# Patient Record
Sex: Male | Born: 1970 | Race: White | Hispanic: No | Marital: Married | Smoking: Never smoker
Health system: Southern US, Community
[De-identification: ages and names within clinical notes are randomized; demographics above are authoritative.]

## PROBLEM LIST (undated history)

## (undated) DIAGNOSIS — K635 Polyp of colon: Secondary | ICD-10-CM

## (undated) DIAGNOSIS — S2239XA Fracture of one rib, unspecified side, initial encounter for closed fracture: Secondary | ICD-10-CM

## (undated) DIAGNOSIS — K859 Acute pancreatitis without necrosis or infection, unspecified: Secondary | ICD-10-CM

## (undated) DIAGNOSIS — S4291XA Fracture of right shoulder girdle, part unspecified, initial encounter for closed fracture: Secondary | ICD-10-CM

## (undated) DIAGNOSIS — K219 Gastro-esophageal reflux disease without esophagitis: Secondary | ICD-10-CM

## (undated) DIAGNOSIS — G4730 Sleep apnea, unspecified: Secondary | ICD-10-CM

## (undated) HISTORY — DX: Polyp of colon: K63.5

## (undated) HISTORY — DX: Fracture of right shoulder girdle, part unspecified, initial encounter for closed fracture: S42.91XA

## (undated) HISTORY — DX: Fracture of one rib, unspecified side, initial encounter for closed fracture: S22.39XA

## (undated) HISTORY — DX: Gastro-esophageal reflux disease without esophagitis: K21.9

---

## 2004-07-12 ENCOUNTER — Encounter: Payer: Self-pay | Admitting: Nurse Practitioner

## 2005-04-13 HISTORY — PX: APPENDECTOMY: SHX54

## 2007-06-30 ENCOUNTER — Other Ambulatory Visit: Payer: Self-pay

## 2007-06-30 ENCOUNTER — Emergency Department: Payer: Self-pay | Admitting: Emergency Medicine

## 2008-04-26 ENCOUNTER — Emergency Department (HOSPITAL_COMMUNITY): Admission: EM | Admit: 2008-04-26 | Discharge: 2008-04-27 | Payer: Self-pay | Admitting: Emergency Medicine

## 2008-04-28 ENCOUNTER — Emergency Department (HOSPITAL_COMMUNITY): Admission: EM | Admit: 2008-04-28 | Discharge: 2008-04-29 | Payer: Self-pay | Admitting: Emergency Medicine

## 2008-04-30 ENCOUNTER — Ambulatory Visit: Payer: Self-pay | Admitting: Gastroenterology

## 2008-04-30 ENCOUNTER — Encounter: Payer: Self-pay | Admitting: Nurse Practitioner

## 2008-04-30 DIAGNOSIS — K859 Acute pancreatitis without necrosis or infection, unspecified: Secondary | ICD-10-CM | POA: Insufficient documentation

## 2008-04-30 DIAGNOSIS — G4733 Obstructive sleep apnea (adult) (pediatric): Secondary | ICD-10-CM | POA: Insufficient documentation

## 2008-04-30 DIAGNOSIS — G4730 Sleep apnea, unspecified: Secondary | ICD-10-CM

## 2008-05-01 LAB — CONVERTED CEMR LAB
Cholesterol: 185 mg/dL (ref 0–200)
HDL: 28.8 mg/dL — ABNORMAL LOW (ref >39.0)
Triglycerides: 88 mg/dL (ref 0–149)
VLDL: 18 mg/dL (ref 0–40)

## 2008-05-02 ENCOUNTER — Telehealth: Payer: Self-pay | Admitting: Nurse Practitioner

## 2008-05-07 ENCOUNTER — Telehealth: Payer: Self-pay | Admitting: Nurse Practitioner

## 2008-05-14 ENCOUNTER — Telehealth: Payer: Self-pay | Admitting: Gastroenterology

## 2008-05-18 ENCOUNTER — Telehealth: Payer: Self-pay | Admitting: Gastroenterology

## 2008-05-28 ENCOUNTER — Ambulatory Visit: Payer: Self-pay | Admitting: Gastroenterology

## 2008-05-30 ENCOUNTER — Telehealth: Payer: Self-pay | Admitting: Gastroenterology

## 2008-05-30 ENCOUNTER — Ambulatory Visit (HOSPITAL_COMMUNITY): Admission: RE | Admit: 2008-05-30 | Discharge: 2008-05-30 | Payer: Self-pay | Admitting: Gastroenterology

## 2008-05-30 ENCOUNTER — Ambulatory Visit: Payer: Self-pay | Admitting: Gastroenterology

## 2008-06-08 ENCOUNTER — Telehealth: Payer: Self-pay | Admitting: Gastroenterology

## 2008-06-25 ENCOUNTER — Ambulatory Visit: Payer: Self-pay | Admitting: Gastroenterology

## 2008-06-26 ENCOUNTER — Encounter: Payer: Self-pay | Admitting: Gastroenterology

## 2008-07-18 ENCOUNTER — Encounter: Payer: Self-pay | Admitting: Gastroenterology

## 2008-07-19 ENCOUNTER — Encounter: Admission: RE | Admit: 2008-07-19 | Discharge: 2008-07-19 | Payer: Self-pay | Admitting: General Surgery

## 2008-08-07 ENCOUNTER — Encounter: Payer: Self-pay | Admitting: Gastroenterology

## 2009-01-31 ENCOUNTER — Inpatient Hospital Stay: Payer: Self-pay | Admitting: Internal Medicine

## 2009-09-10 ENCOUNTER — Ambulatory Visit: Payer: Self-pay | Admitting: Family Medicine

## 2010-04-30 ENCOUNTER — Ambulatory Visit: Payer: Self-pay | Admitting: Family Medicine

## 2010-05-04 ENCOUNTER — Encounter: Payer: Self-pay | Admitting: Gastroenterology

## 2010-05-22 ENCOUNTER — Other Ambulatory Visit: Payer: Self-pay | Admitting: Podiatry

## 2010-05-22 DIAGNOSIS — T148XXA Other injury of unspecified body region, initial encounter: Secondary | ICD-10-CM

## 2010-05-22 DIAGNOSIS — R52 Pain, unspecified: Secondary | ICD-10-CM

## 2010-05-27 ENCOUNTER — Other Ambulatory Visit: Payer: Self-pay

## 2010-07-28 LAB — DIFFERENTIAL
Basophils Absolute: 0 10*3/uL (ref 0.0–0.1)
Basophils Relative: 0 % (ref 0–1)
Eosinophils Absolute: 0.2 10*3/uL (ref 0.0–0.7)
Eosinophils Relative: 2 % (ref 0–5)
Lymphocytes Relative: 25 % (ref 12–46)
Lymphocytes Relative: 12 % (ref 12–46)
Lymphs Abs: 2.8 10*3/uL (ref 0.7–4.0)
Lymphs Abs: 1.4 10*3/uL (ref 0.7–4.0)
Monocytes Absolute: 0.8 10*3/uL (ref 0.1–1.0)
Monocytes Relative: 7 % (ref 3–12)
Neutro Abs: 9.2 10*3/uL — ABNORMAL HIGH (ref 1.7–7.7)
Neutrophils Relative %: 66 % (ref 43–77)
Neutrophils Relative %: 79 % — ABNORMAL HIGH (ref 43–77)

## 2010-07-28 LAB — COMPREHENSIVE METABOLIC PANEL
ALT: 22 U/L (ref 0–53)
ALT: 19 U/L (ref 0–53)
AST: 22 U/L (ref 0–37)
AST: 20 U/L (ref 0–37)
Albumin: 4.1 g/dL (ref 3.5–5.2)
Albumin: 3.8 g/dL (ref 3.5–5.2)
Alkaline Phosphatase: 55 U/L (ref 39–117)
Alkaline Phosphatase: 61 U/L (ref 39–117)
BUN: 19 mg/dL (ref 6–23)
BUN: 8 mg/dL (ref 6–23)
CO2: 24 mEq/L (ref 19–32)
CO2: 27 mEq/L (ref 19–32)
Calcium: 9 mg/dL (ref 8.4–10.5)
Calcium: 8.8 mg/dL (ref 8.4–10.5)
Chloride: 105 mEq/L (ref 96–112)
Chloride: 100 mEq/L (ref 96–112)
Creatinine, Ser: 0.86 mg/dL (ref 0.4–1.5)
Creatinine, Ser: 1.03 mg/dL (ref 0.4–1.5)
GFR calc Af Amer: >60 mL/min (ref >60)
GFR calc Af Amer: >60 mL/min (ref >60)
GFR calc non Af Amer: >60 mL/min (ref >60)
GFR calc non Af Amer: >60 mL/min (ref >60)
Glucose, Bld: 81 mg/dL (ref 70–99)
Glucose, Bld: 102 mg/dL — ABNORMAL HIGH (ref 70–99)
Potassium: 3.7 mEq/L (ref 3.5–5.1)
Potassium: 4.3 mEq/L (ref 3.5–5.1)
Sodium: 139 mEq/L (ref 135–145)
Sodium: 133 mEq/L — ABNORMAL LOW (ref 135–145)
Total Bilirubin: 1.5 mg/dL — ABNORMAL HIGH (ref 0.3–1.2)
Total Bilirubin: 1.8 mg/dL — ABNORMAL HIGH (ref 0.3–1.2)
Total Protein: 7.1 g/dL (ref 6.0–8.3)
Total Protein: 7.2 g/dL (ref 6.0–8.3)

## 2010-07-28 LAB — URINALYSIS, ROUTINE W REFLEX MICROSCOPIC
Bilirubin Urine: NEGATIVE
Glucose, UA: NEGATIVE mg/dL
Hgb urine dipstick: NEGATIVE
Ketones, ur: NEGATIVE mg/dL
Nitrite: NEGATIVE
Protein, ur: NEGATIVE mg/dL
Specific Gravity, Urine: 1.028 (ref 1.005–1.030)
Urobilinogen, UA: 0.2 mg/dL (ref 0.0–1.0)
pH: 6 (ref 5.0–8.0)

## 2010-07-28 LAB — CBC
HCT: 45.4 % (ref 39.0–52.0)
Hemoglobin: 15.3 g/dL (ref 13.0–17.0)
Hemoglobin: 15.3 g/dL (ref 13.0–17.0)
MCHC: 33.8 g/dL (ref 30.0–36.0)
MCHC: 33.8 g/dL (ref 30.0–36.0)
MCV: 85.9 fL (ref 78.0–100.0)
MCV: 85.5 fL (ref 78.0–100.0)
Platelets: 212 10*3/uL (ref 150–400)
RBC: 5.25 MIL/uL (ref 4.22–5.81)
RBC: 5.31 MIL/uL (ref 4.22–5.81)
RDW: 13.6 % (ref 11.5–15.5)
WBC: 11.6 10*3/uL — ABNORMAL HIGH (ref 4.0–10.5)

## 2010-07-28 LAB — POCT I-STAT, CHEM 8
BUN: 23 mg/dL (ref 6–23)
Calcium, Ion: 1.18 mmol/L (ref 1.12–1.32)
Chloride: 106 meq/L (ref 96–112)
Creatinine, Ser: 1 mg/dL (ref 0.4–1.5)
Glucose, Bld: 85 mg/dL (ref 70–99)
HCT: 46 % (ref 39.0–52.0)
Hemoglobin: 15.6 g/dL (ref 13.0–17.0)
Potassium: 4 mEq/L (ref 3.5–5.1)
Sodium: 142 meq/L (ref 135–145)
TCO2: 26 mmol/L (ref 0–100)

## 2010-07-28 LAB — LIPASE, BLOOD
Lipase: 1410 U/L — ABNORMAL HIGH (ref 11–59)
Lipase: 293 U/L — ABNORMAL HIGH (ref 11–59)

## 2010-07-28 LAB — AMYLASE: Amylase: 221 U/L — ABNORMAL HIGH (ref 27–131)

## 2010-07-29 LAB — MISCELLANEOUS TEST

## 2010-08-26 NOTE — Consult Note (Signed)
NAME:  Nicholas Hobbs, Nicholas Hobbs NO.:  [id number]   MEDICAL RECORD NO.:  [id number]          PATIENT TYPE:  EMS   LOCATION:  MAJO                         FACILITY:  [facility]   PHYSICIAN:  Vania Rea, M.D. DATE OF BIRTH:  09-Feb-1971   DATE OF CONSULTATION:  04/29/2008  DATE OF DISCHARGE:  04/29/2008                                 CONSULTATION   PRIMARY CARE PHYSICIAN:  Dr. Julieanne Manson in [place].   REQUESTING PHYSICIAN:  Dr. Radford Pax, emergency room physician.   REASON FOR CONSULTATION:  Acute pancreatitis.   IMPRESSION:  1. Acute pancreatitis, resolving.  2. Abdominal pain inadequately controlled.  3. Obesity.   RECOMMENDATIONS:  1. Discharge the patient home to follow up with [facility] on Monday,      January 18, as discussed with Dr. Leone Payor of [facility].  2. Change pain medications to oxycodone every 3 hours p.r.n.  3. Continue clear liquid diet.  4. Return to ED if getting worse before Monday.   HISTORY OF PRESENT ILLNESS:  This is the third episode of acute  pancreatitis for this 40 year old, obese Caucasian gentleman.  The first  episode of pancreatitis was around 2003 to 2004.  He was treated at  [facility].  It was presumed to be alcohol related.  He  was hospitalized for about a week and discharged home in satisfactory  condition.  He had a second episode of pancreatitis in 2005.  Did not  resolve after extended hospital stay, and he was transferred to [facility] where he had ERCP and had a pancreatic stent  placed, per the patient, because of a stone. The patient did not have  cholecystectomy, was discharged home in satisfactory condition, and he  has had no problems until 2 days ago when he developed sharp left  anterior abdominal pain which he recognized as being similar to his  previous episodes of pancreatitis, associated with nausea.  There was no  vomiting.  It was aggravated by eating.  The patient  presented to the  emergency room at [facility].  He was evaluated and found to have a  serum lipase of over 1400.  A CT scan was done which showed inflammation  of the body and tail of the pancreas.  The gallbladder was contracted.  The patient was treated with Percocet 5/325 two tablets every 6 hours  p.r.n.  Advised to have clear liquid diet and discharged home.  The  patient returns this evening complaining of complete cessation of all  nausea.  He is tolerating a clear liquid diet very well.  However, the  left flank pain persists and is only temporarily relieved with Percocet.  He states that he is afraid of taking too much Percocet because of the  Tylenol content. He denies any fever or diarrhea.  In fact he has had no  bowel movement since Thursday.  He denies any abdominal swelling.  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  He says more in summer  and  more on weekends.  He last drank 2 beers about 2 weeks prior to the  start of this episode.  Also the patient has recently been on a diet 3  weeks ago.  He started a purification regimen, using [employer] herbal  cleansing preparations, and completed that about 5 days before the onset  of his symptoms.  He has lost about 15 pounds over the past 2 weeks,  through his deliberate actions.   PAST MEDICAL HISTORY:  1. Recurrent pancreatitis status post pancreatic stent in 2005 at      [facility].  2. History of GERD.  3. History of testicular torsion as a teenager.  4. Appendectomy in 2006.   MEDICATIONS:  1. Prilosec 20 mg daily.  2. Fish oil 1 tablet twice daily.  3. Percocet as noted above.   ALLERGIES:  BIAXIN causes itching and rash.   SOCIAL HISTORY:  He denies tobacco use.  Alcohol use as noted above.  Denies illicit drug use.  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   FAMILY HISTORY:  Significant for a sister status  post cholecystectomy  for acute cholecystitis.  Otherwise family history is unremarkable.   REVIEW OF SYSTEMS:  Other than noted above, a 10-point review of systems  is unremarkable.   PHYSICAL EXAMINATION:  GENERAL APPEARANCE: A pleasant, somewhat obese  young Caucasian gentleman lying in the stretcher, comfortable, in no  distress.  VITAL SIGNS: Temperature 98.9, pulse 71, respirations 18, blood pressure  134/77.  He is saturating at 96% on room air.  HEENT: His pupils are round and equal.  Mucous membranes are pink and  anicteric.  NECK: He has no cervical lymphadenopathy or thyromegaly.  No jugular  venous distention.  CHEST:  Clear to auscultation bilaterally.  CARDIOVASCULAR: Regular rhythm without murmur.  ABDOMEN:  Obese but soft.  He does have some left flank tenderness.  He  has normal abdominal bowel sounds.  EXTREMITIES:  Without edema. He has 2+ pulses bilaterally.  CENTRAL NERVOUS SYSTEM:  Cranial nerves II through XII are grossly  intact, and he has no focal neurological deficits.   LABS:  His white count is 11.6, hemoglobin 5.3, platelets 212.  He has  79% neutrophils.  His absolute neutrophil count is 9.2.  His sodium is  133, potassium 4.3, chloride 100, CO2 of 27, BUN 8, creatinine 1.03,  glucose 102.  His total bilirubin is 1.8.  His liver functions are  otherwise normal.  Total protein 7.2, albumin 3.8.  His calcium is 8.8.  His lipase today has decreased to 193.   A CT scan of the abdomen done yesterday showed stranding/inflammation  around the pancreatic body and tail, compatible with acute pancreatitis.  The underlying gland appears to enhance.  There is a small amount of  inflammation of the left anterior pararenal space.  Liver, spleen,  adrenals, kidneys unremarkable.  Gallbladder is  contracted.  The bowels are grossly unremarkable.  No free fluid, free  air, or adenopathy.  Accessory spleen noted in the splenic hilum.  Minimal dependent atelectasis in  the lung bases. No effusions.  A CT  scan of the pelvis was unremarkable.      Vania Rea, M.D.  Electronically Signed     LC/MEDQ  D:  04/29/2008  T:  04/29/2008  Job:  [id number]   cc:   Keturah Barre, MD,FACG  Nelva Nay, MD

## 2010-09-18 IMAGING — CT CT ABDOMEN W/ CM
2 of 5 series · 17 of 46 positions shown, 19 images · IV contrast (agent unspecified)
Comparison: None

CT ABDOMEN

CLINICAL DATA: Left upper quadrant pain

CT ABDOMEN AND PELVIS WITH CONTRAST
TECHNIQUE: Multidetector CT imaging of the abdomen and pelvis was
performed using the standard protocol following bolus
administration of intravenous contrast.
Contrast: 100 ml 3mnipaque-1WW

[Series 2: abd/pelv with 5.0 b31f st · axial · 0.82mm/px · z∈[+662,+1086]mm · 14 of 97 slices shown, 16 images]
[im 6/97  soft-tissue]
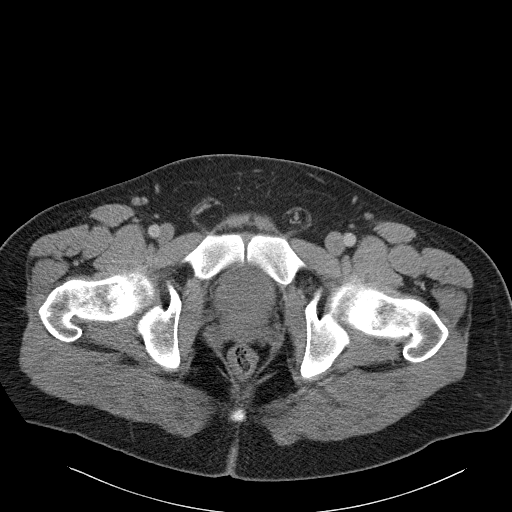
[im 6/97  bone]
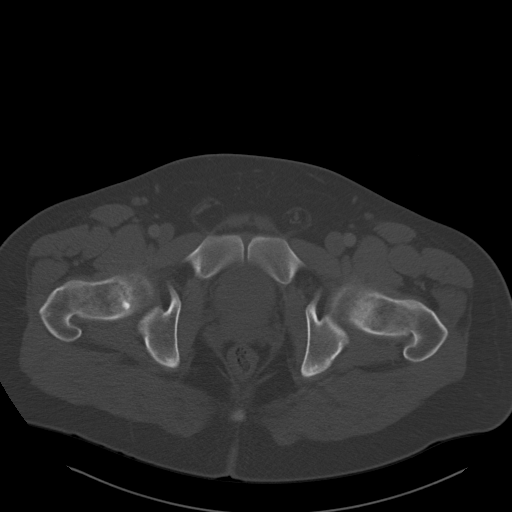
[im 11/97  soft-tissue]
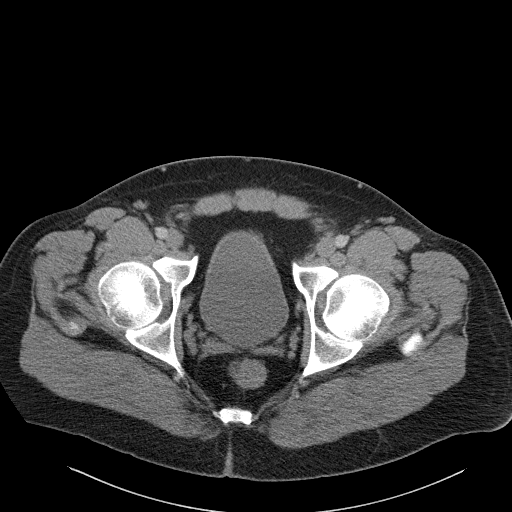
[im 22/97  soft-tissue]
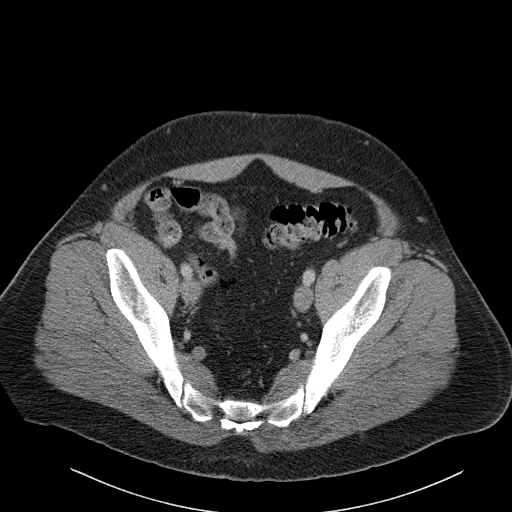
[im 27/97  soft-tissue]
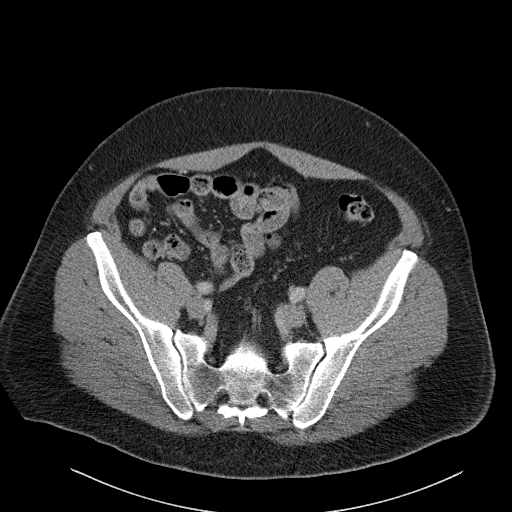
[im 33/97  soft-tissue]
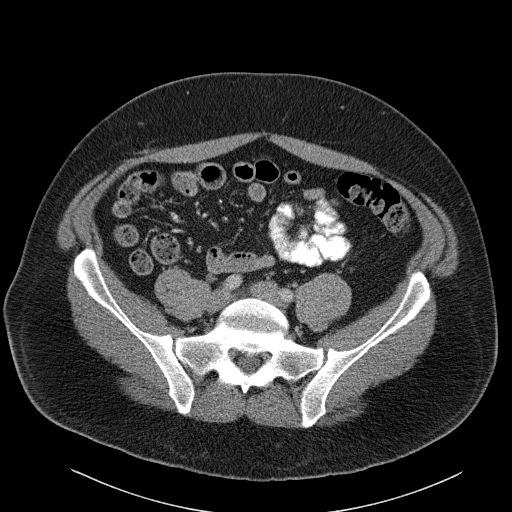
[im 38/97  soft-tissue]
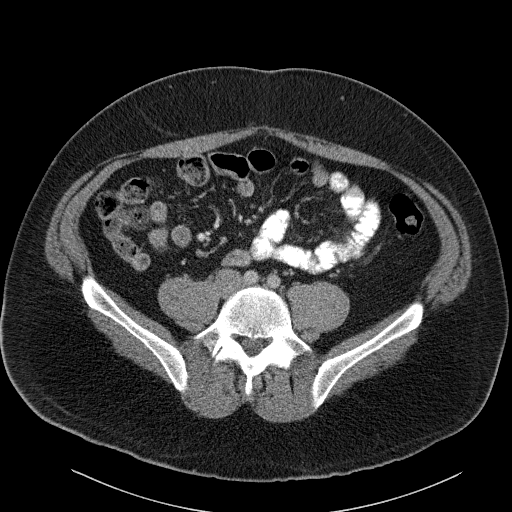
[im 43/97  soft-tissue]
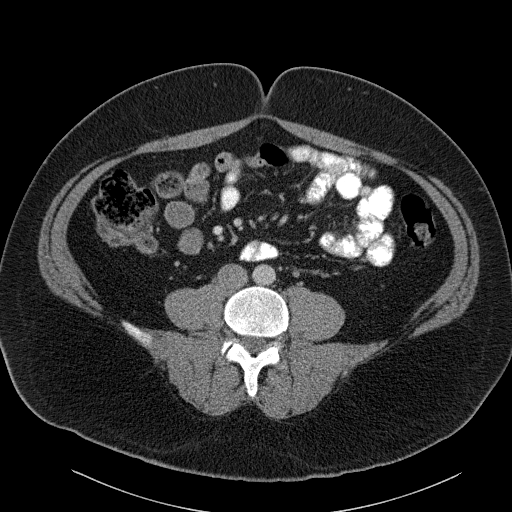
[im 54/97  soft-tissue]
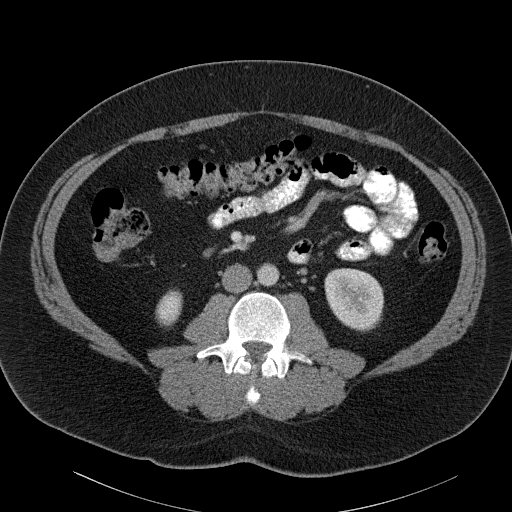
[im 59/97  soft-tissue]
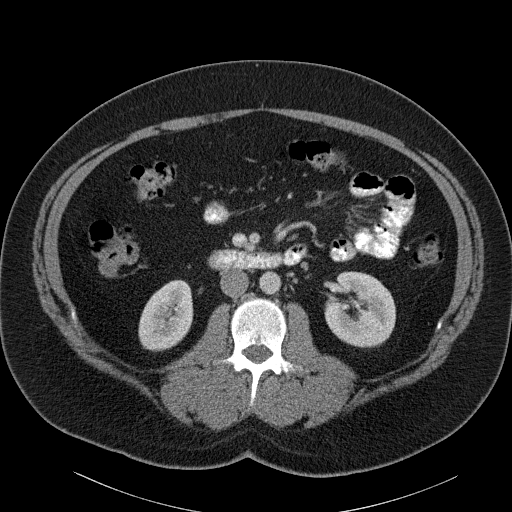
[im 59/97  bone]
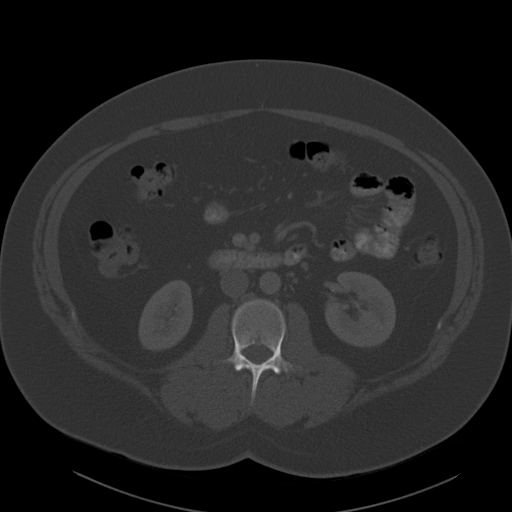
[im 65/97  soft-tissue]
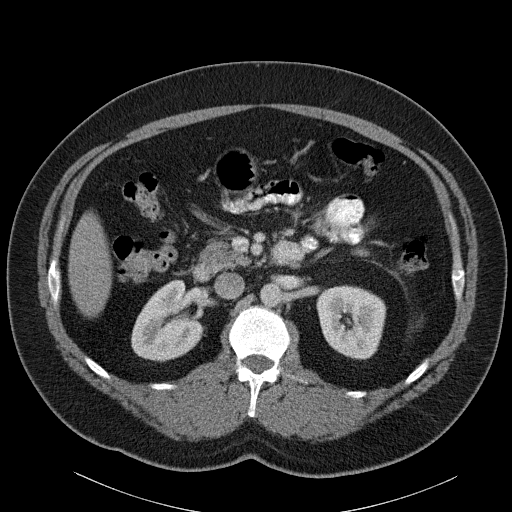
[im 70/97  soft-tissue]
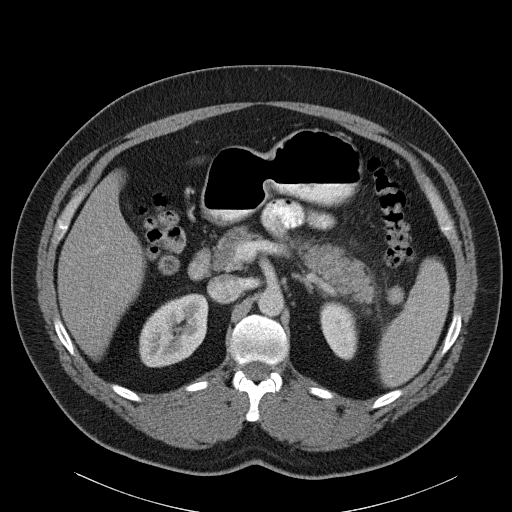
[im 75/97  soft-tissue]
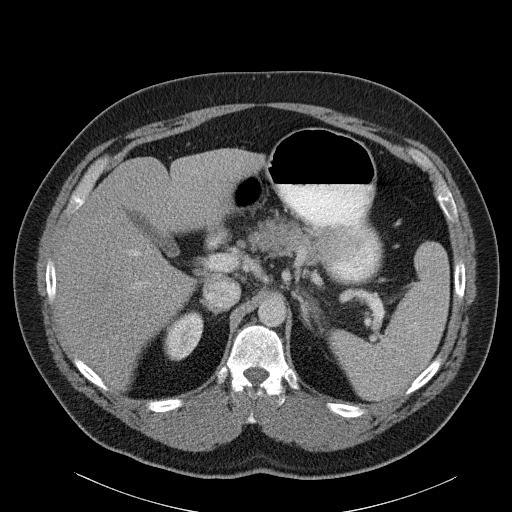
[im 86/97  soft-tissue]
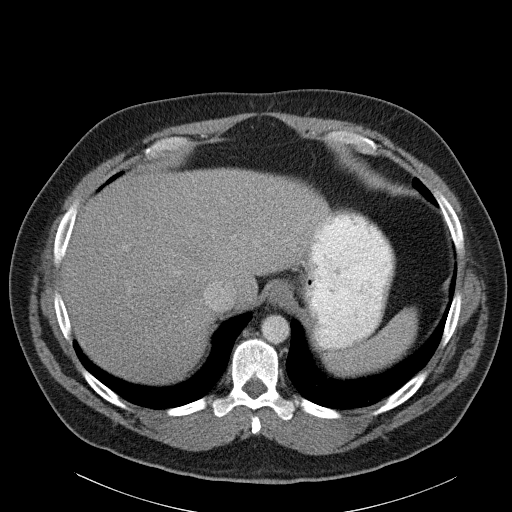
[im 91/97  soft-tissue]
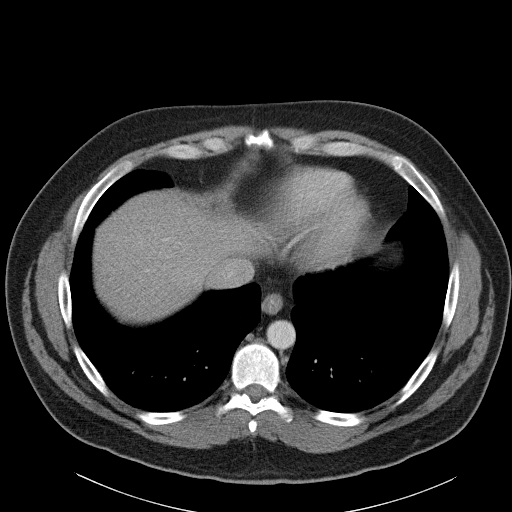

[Series 5: abd/pelv with 2.0 spo cor st · coronal · 0.94mm/px · 3 of 145 slices shown]
[im 49/145  soft-tissue]
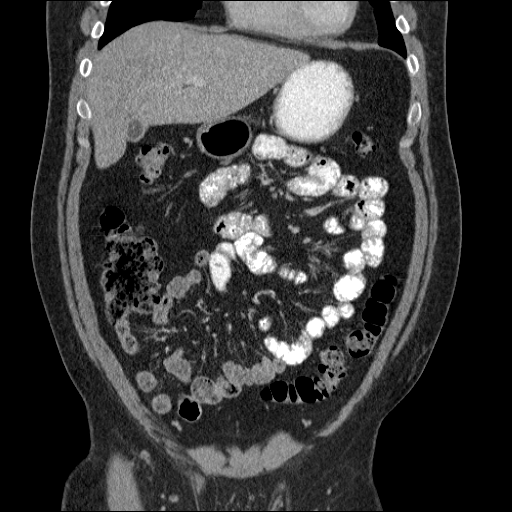
[im 65/145  soft-tissue]
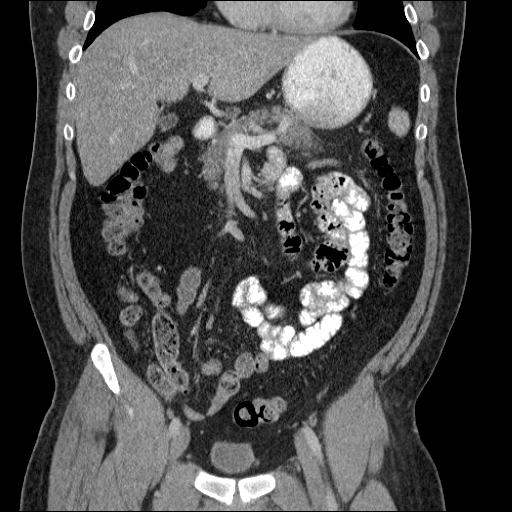
[im 81/145  soft-tissue]
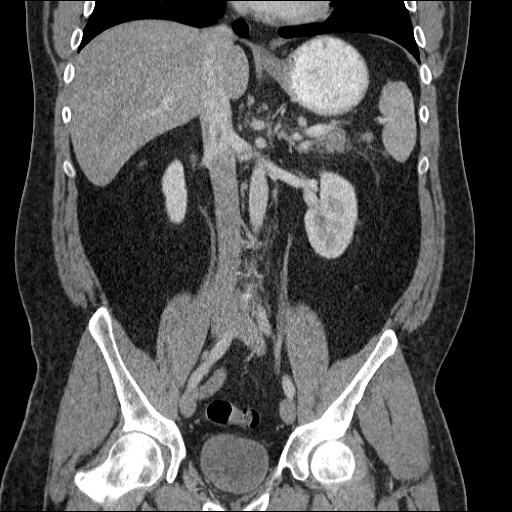

[17 of 46 positions shown; findings below may reference images not displayed]

FINDINGS: There is stranding/inflammation around the pancreatic
body and tail, compatible with acute pancreatitis.  Underlying
gland appears to enhance normally.  Small amount of inflammation in
the left anterior pararenal space.

Liver, spleen, adrenals, kidneys unremarkable.  Gallbladder is
contracted. Bowel grossly unremarkable.  No free fluid, free air,
or adenopathy. Accessory spleen noted in the splenic hilum.

Minimal dependent atelectasis in the lung bases.  No effusions.
IMPRESSION: Changes of acute pancreatitis involving the pancreatic body and
tail.

CT PELVIS
FINDINGS: Bowel grossly unremarkable.  No free fluid, free air, or
adenopathy. Urinary bladder unremarkable.

No acute bony abnormality.
IMPRESSION: No acute findings in the pelvis.

## 2011-04-28 ENCOUNTER — Ambulatory Visit: Payer: Self-pay | Admitting: Internal Medicine

## 2011-04-28 LAB — DOT URINE DIP
Blood: NEGATIVE
Protein: NEGATIVE

## 2012-01-18 ENCOUNTER — Inpatient Hospital Stay: Payer: Self-pay | Admitting: Internal Medicine

## 2012-01-18 LAB — URINALYSIS, COMPLETE
Bacteria: NONE SEEN
Blood: NEGATIVE
Glucose,UR: NEGATIVE mg/dL (ref 0–75)
Leukocyte Esterase: NEGATIVE
Nitrite: NEGATIVE

## 2012-01-18 LAB — CBC
MCH: 30.7 pg (ref 26.0–34.0)
MCV: 88 fL (ref 80–100)
Platelet: 179 10*3/uL (ref 150–440)
RBC: 4.79 10*6/uL (ref 4.40–5.90)
RDW: 13.1 % (ref 11.5–14.5)

## 2012-01-18 LAB — COMPREHENSIVE METABOLIC PANEL
Anion Gap: 9 (ref 7–16)
Chloride: 109 mmol/L — ABNORMAL HIGH (ref 98–107)
Creatinine: 0.84 mg/dL (ref 0.60–1.30)
EGFR (African American): >60
Osmolality: 285 (ref 275–301)
Potassium: 4.1 mmol/L (ref 3.5–5.1)
SGOT(AST): 34 U/L (ref 15–37)
Sodium: 142 mmol/L (ref 136–145)
Total Protein: 7.4 g/dL (ref 6.4–8.2)

## 2012-01-18 LAB — TROPONIN I: Troponin-I: <0.02 ng/mL

## 2012-01-19 LAB — COMPREHENSIVE METABOLIC PANEL
Anion Gap: 8 (ref 7–16)
Calcium, Total: 8.3 mg/dL — ABNORMAL LOW (ref 8.5–10.1)
Co2: 26 mmol/L (ref 21–32)
EGFR (Non-African Amer.): >60
Osmolality: 280 (ref 275–301)
Potassium: 4.3 mmol/L (ref 3.5–5.1)
Sodium: 140 mmol/L (ref 136–145)

## 2012-01-19 LAB — CBC WITH DIFFERENTIAL/PLATELET
Basophil #: 0.1 10*3/uL (ref 0.0–0.1)
Basophil %: 0.6 %
Lymphocyte %: 11.7 %
Monocyte %: 8.2 %
Neutrophil #: 9 10*3/uL — ABNORMAL HIGH (ref 1.4–6.5)
Neutrophil %: 77.9 %
Platelet: 154 10*3/uL (ref 150–440)
RBC: 4.67 10*6/uL (ref 4.40–5.90)
WBC: 11.5 10*3/uL — ABNORMAL HIGH (ref 3.8–10.6)

## 2012-01-19 LAB — TROPONIN I: Troponin-I: <0.02 ng/mL

## 2012-01-20 LAB — LIPID PANEL
Ldl Cholesterol, Calc: 96 mg/dL (ref 0–100)
VLDL Cholesterol, Calc: 18 mg/dL (ref 5–40)

## 2012-01-21 LAB — PRO B NATRIURETIC PEPTIDE: B-Type Natriuretic Peptide: 194 pg/mL — ABNORMAL HIGH (ref 0–125)

## 2012-02-01 DIAGNOSIS — K859 Acute pancreatitis without necrosis or infection, unspecified: Secondary | ICD-10-CM | POA: Insufficient documentation

## 2012-03-14 ENCOUNTER — Ambulatory Visit: Payer: Self-pay | Admitting: Medical

## 2012-04-04 ENCOUNTER — Ambulatory Visit: Payer: Self-pay | Admitting: Internal Medicine

## 2012-05-02 ENCOUNTER — Ambulatory Visit: Payer: Self-pay | Admitting: Emergency Medicine

## 2012-05-04 ENCOUNTER — Ambulatory Visit: Payer: Self-pay | Admitting: Family Medicine

## 2012-05-09 ENCOUNTER — Ambulatory Visit: Payer: Self-pay | Admitting: Emergency Medicine

## 2012-08-20 ENCOUNTER — Ambulatory Visit: Payer: Self-pay | Admitting: Orthopedic Surgery

## 2012-10-21 ENCOUNTER — Ambulatory Visit: Payer: Self-pay | Admitting: General Practice

## 2014-05-08 DIAGNOSIS — M179 Osteoarthritis of knee, unspecified: Secondary | ICD-10-CM | POA: Insufficient documentation

## 2014-05-08 DIAGNOSIS — M1710 Unilateral primary osteoarthritis, unspecified knee: Secondary | ICD-10-CM | POA: Insufficient documentation

## 2014-05-08 DIAGNOSIS — S83249A Other tear of medial meniscus, current injury, unspecified knee, initial encounter: Secondary | ICD-10-CM | POA: Insufficient documentation

## 2014-05-09 ENCOUNTER — Other Ambulatory Visit: Payer: Self-pay | Admitting: Orthopedic Surgery

## 2014-05-09 DIAGNOSIS — M25562 Pain in left knee: Secondary | ICD-10-CM

## 2014-05-14 ENCOUNTER — Ambulatory Visit
Admission: RE | Admit: 2014-05-14 | Discharge: 2014-05-14 | Disposition: A | Discharge status: Home / Self Care | Payer: Managed Care, Other (non HMO) | Source: Ambulatory Visit | Attending: Orthopedic Surgery | Admitting: Orthopedic Surgery

## 2014-05-14 DIAGNOSIS — M25562 Pain in left knee: Secondary | ICD-10-CM

## 2014-06-12 HISTORY — PX: KNEE SURGERY: SHX244

## 2014-06-18 ENCOUNTER — Ambulatory Visit: Payer: Self-pay | Admitting: General Practice

## 2014-07-31 NOTE — Consult Note (Signed)
PATIENT NAME:  Nicholas Hobbs, WRINKLE MR#:  [id number] DATE OF BIRTH:  [birth year]  DATE OF CONSULTATION:  01/20/2012  PRIMARY CARE PHYSICIAN: Richard L. Rosanna Randy, MD CONSULTING PHYSICIAN:  Jill Side, MD  REASON FOR CONSULTATION: Pancreatitis.   HISTORY OF PRESENT ILLNESS: The patient is a 44 year old male with a history of recurrent pancreatitis, according to history. According to the patient, in the last 10 or 12 years he has had four episodes of acute pancreatitis. He has been evaluated by Dr. Dossie Der at [facility] and apparently has had pancreatic stents placed in the past. I do not have those records available to me. The patient was admitted day before yesterday with abdominal pain and chest pain. Lipase was elevated at 1400. CT scan was suggestive of pancreatic inflammation, and the patient was admitted with a diagnosis of acute pancreatitis. The patient was evaluated yesterday as well as today. He is complaining of epigastric pain although the pain is better. He has had no vomiting.   PAST MEDICAL HISTORY: As above.  ALLERGIES: Biaxin.   SOCIAL HISTORY: The patient denies drinking heavily. According to him, he drinks only occasionally but does not really quantify it. 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   PAST SURGICAL HISTORY: Appendectomy.   FAMILY HISTORY: Significant for hypertension.   MEDICATIONS: He takes over-the-counter Prilosec.   REVIEW OF SYSTEMS: Review of systems is grossly negative except for what is mentioned in the History of Present Illness.   PHYSICAL EXAMINATION:  GENERAL: A well-built male who does not appear to be in any acute distress.   SKIN: Somewhat flushed, does not appear to be jaundiced or anemic.   VITAL SIGNS: Vitals are fairly stable with a temperature of 98, respirations 18, pulse 62, blood pressure 110/73. He had a low-grade fever of 100.3 yesterday.   NECK: Neck veins are flat.   LUNGS: Clear to auscultation bilaterally with fair air entry and no  added sounds.   CARDIOVASCULAR: Regular rate and rhythm. No gallops or murmur.   ABDOMEN: Mild epigastric tenderness. There is no rebound. No Murphy's sign. No hepatosplenomegaly. Bowel sounds are present although somewhat sluggish.   NEUROLOGICAL: Examination appears to be unremarkable.   LABORATORY, DIAGNOSTIC AND RADIOLOGICAL DATA: White cell count is 11.5, hemoglobin 14.1, hematocrit 40, platelet count of 154. Serum lipase was 1400 on admission and is 700 today. Electrolytes are fine as well as BUN and creatinine and liver enzymes. CT scan of the abdomen and pelvis showed some peripancreatic stranding, otherwise unremarkable. An ultrasound showed no gallstones and no CBD dilation.   ASSESSMENT AND PLAN: The patient is with recurrent acute pancreatitis. The patient denies being a heavy drinker, although it appears that he has been drinking on a consistent basis. 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 His gallbladder ultrasound is normal and liver enzymes are normal. Biliary pancreatitis is very unlikely. I do not have all of his previous records. We did check him for hypertriglyceridemia and his lipid  profile is negative as well. I have had a detailed discussion with him and his wife as well as his  mother that he needs to quit drinking completely because, again, even though he is not a heavy drinker alcohol even in moderate amount can cause recurrent pancreatitis. The patient and the family would like to have another explanation for his pancreatitis, and in this case I would recommend him to go back and follow with a gastroenterologist at [facility] where he has had work-up done in  the past for further evaluation. We will start him on a clear liquid diet, advance as tolerated. The case has been discussed with [facility].  ____________________________ Jill Side, MD si:cbb D: 01/20/2012 17:11:36 ET T: 01/20/2012 18:26:39 ET JOB#: [id number]  Jill Side  MD ELECTRONICALLY SIGNED 02/02/2012 17:22

## 2014-07-31 NOTE — Discharge Summary (Signed)
PATIENT NAME:  Nicholas Hobbs, Nicholas Hobbs MR#:  [id number] DATE OF BIRTH:  1970-07-18  DATE OF ADMISSION:  01/18/2012 DATE OF DISCHARGE:  01/21/2012  PRIMARY MEDICAL DOCTORS:  Miguel Aschoff, MD  DISCHARGE DIAGNOSIS: Acute pancreatitis.   HISTORY OF PRESENT ILLNESS: This is a 44 year old man with no past medical history other than repeated pancreatitis. He comes to the Emergency Room with having epigastric pain which was causing him trouble breathing and pain was of radiating to the back. He denied any nausea, vomiting, or diarrhea. He did not have any fever. In Emergency Room CT of the abdomen and lab work was done which was suggestive for acute pancreatitis and so he was admitted with acute pancreatitis.  HOSPITAL STAY: AND MANAGEMENT. On further questioning next day we found that he had a similar episode of pancreatitis 4 times, and once in the past at [facility] they placed some stent in his pancreas but he was not sure because next time when he had pancreatitis at [facility], they did not show any evidence of any stent in the pancreas but the patient denied any history of alcoholism or using any prescription or over-the-counter medication, so we are assuming this might be just because of his minimal social use of alcohol and or some underlying pancreatic abnormality as he is telling there was stent placement once in the past. GI consult was done to find out the cause of his pancreatitis. His pancreatitis symptoms were managed symptomatically with IV fluids, pain management and IV PPI and antiemetics. Symptoms improved significantly within the next 2 days and he started tolerating diet. From GI point of view, Dr Dionne Milo saw him and by listening to this history, he suggested for him to go back to [facility] as outpatient and let them do further work-up.   LAB RESULTS:  Urinalysis on presentation, negative. WBCs are 11.2 in blood. Lipase was 1400 on presentation, which gradually came down to 725 the next  day and 208 the third day. Abdominal x-ray was done, which showed nonobstructive bowel gas pattern. CT of the abdomen was done and the impression was some peripancreatic stranding correlate  to pancreatitis was his duodenitis. No discrete mass or ductal dilation. Otherwise unremarkable study. As per GI recommendation, sonogram of abdomen was also done which showed no evidence  cholelithiasis, grossly normal abdominal sonogram. I spoke to Dr. Dionne Milo on the day of discharge, as patient was tolerating a solid diet and he was pain managed only by Percocet so he suggested to call Dr Michaelle Copas office if you do not get any call from [facility] clinic for his further appointment about pancreatic work-up and I provided the patient with number. He agreed and follow with those things.  Other issues during the hospital stay:  Patient was concerned about there was a small nodule on his scrotum about size of 0.4 mm by 0.4 mm, small, raised area, without any tenderness or itching. This nodule he noticed after he had tick bite at the same site in May 2013  but he denied any associated rash on the body, any joint pain, any fever, or any other symptoms after the episode, so I reassured him that this is a benign finding and if of the above-mentioned symptoms happen he is to go to a doctor, and he agreed.  CONDITION ON DISCHARGE: Satisfactory.   CODE STATUS: FULL CODE.   DISCHARGE MEDICATIONS:  Prilosec over-the-counter and Percocet 325 mg/5 mg oral tablet 2 tablets 3 times a day as needed  and he was advised to stop taking Garcinia Cambogia Plus tablet that was some herbal supplements he was taking. Call Dr.Iftikar's office, if he does not receive any call from [facility], and he was advised to follow up within 1 to 2 weeks with his doctor, Dr. Miguel Aschoff, [facility].   Total Time Spent in discharge 45 minutes.   ____________________________ Ceasar Lund Anselm Jungling,  MD vgv:ljs D: 01/21/2012 15:05:01 ET T: 01/22/2012 12:20:18 ET JOB#: [id number]  cc: Ceasar Lund. Anselm Jungling, MD, <Dictator> Richard L. Rosanna Randy, MD Vaughan Basta MD ELECTRONICALLY SIGNED 02/02/2012 18:12

## 2014-07-31 NOTE — Consult Note (Signed)
Brief Consult Note: Diagnosis: Recurrent pancreatitis.   Patient was seen by consultant.   Discussed with Attending MD.   Comments: Recurrent pancreatitis most likely ETOH related.  Recommendations: Clear liquid diet. [place] in am. Serum triglycerides.  Electronic Signatures: Jill Side (MD)  (Signed 08-Oct-13 17:57)  Authored: Brief Consult Note   Last Updated: 08-Oct-13 17:57 by Jill Side (MD)

## 2014-07-31 NOTE — H&P (Signed)
PATIENT NAME:  Nicholas Hobbs, Nicholas Hobbs MR#:  [id number] DATE OF BIRTH:  08-Mar-1971  DATE OF ADMISSION:  01/18/2012  PRIMARY DOCTOR: Dr. Rosanna Randy   ER PHYSICIAN: Dr. Lenise Arena   CHIEF COMPLAINT: Chest pain.   HISTORY OF PRESENT ILLNESS: The patient is a 44 year old male with no past medical history who came in because of epigastric and chest pain. Chest pain actually started yesterday associated with some trouble breathing. The patient says the pain was radiating to the back and it stayed all day yesterday. The patient took some Aleve last night and had a little bit of relief and went to sleep. No nausea. No vomiting. He did not have abdominal pain. This morning pain came back again, his chest pain and epigastric pain, and says he has a lot of burps and even after that the pain continues there and he came to the ER. No vomiting. No problems when he eats. He did not have nausea or vomiting. No diarrhea. No fever. The patient's lipase is elevated in the Emergency Room. I was asked to admit for acute pancreatitis.   PAST MEDICAL HISTORY: History of pancreatitis about four times in the last 12 years. He was admitted to [place].    ALLERGIES: Biaxin.   SOCIAL HISTORY: No smoking. Occasional drinking. 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   PAST SURGICAL HISTORY: The patient had appendectomy.   FAMILY HISTORY: Dad had hypertension.   MEDICATIONS: Takes over-the-counter Prilosec.   REVIEW OF SYSTEMS: CONSTITUTIONAL: Has no fever. No fatigue. EYES: No blurred vision. ENT: No tinnitus. The patient's hearing is intact. No snoring. No sinus pain. The patient denies any difficulty swallowing. RESPIRATORY: No cough. CARDIOVASCULAR: Complains of lower chest pain and epigastric pain. No palpitations. No syncope. GI: Has some midepigastric pain going to the back. No nausea. No vomiting. GU: No dysuria. ENDOCRINE: No polyuria or nocturia. INTEGUMENTARY: No skin  rashes. MUSCULOSKELETAL: Had some low back pain last night. NEUROLOGIC: No numbness or weakness. No TIAs. PSYCH: Oriented to time, place, and person.   PHYSICAL EXAMINATION:   VITAL SIGNS: Temperature 97.9, pulse 77, respirations 20, blood pressure 139/70, sats 100% on room air.  GENERAL: Alert, awake, oriented, not in distress, answering questions appropriately.   HEENT: Head atraumatic, normocephalic. Pupils equally reacting to light. Extraocular movements intact.   ENT: No tympanic membrane congestion. No turbinate hypertrophy. No oropharyngeal erythema.   NECK: Normal range of motion. No JVD. No carotid bruit.   RESPIRATORY: Clear to auscultation. No wheeze. No rales. Not using accessory muscles of respiration.   CARDIOVASCULAR: S1, S2 regular. PMI not displaced. Chest nontender. Good pedal pulses. No extremity edema.   ABDOMEN: Epigastric tenderness and also left upper quadrant tenderness present. No rebound tenderness. Bowel sounds present. Abdomen is not distended. No hernias.   MUSCULOSKELETAL: Strength 5/5 upper and lower extremities.   SKIN: No skin rashes.   NEUROLOGIC: Cranial nerves II through XII intact.   PSYCHIATRIC: Oriented to time, place, person.   LABORATORY, DIAGNOSTIC, AND RADIOLOGICAL DATA: UA clear, no leukocyte esterase. WBC 11.2, hemoglobin 14.7, hematocrit 41.9, platelets 179. Electrolytes sodium 142, potassium 4.9, chloride 100, bicarb 24, BUN 20, creatinine 0.8, glucose 93. Lipase is 1402. LFTs are within normal limits. ALT 29, AST 34, alkaline phosphatase 76, total bilirubin 1.0. Troponin less than 0.02.  Abdominal x-ray shows heart and mediastinum normal. No focal opacities. No free air. The patient has relative posse of bowel gas. Nonobstructed bowel gas pattern.  EKG showed normal sinus rhythm. No ST-T changes. 75 beats per minute.   ASSESSMENT AND PLAN: This is a 44 year old male with atypical symptoms of lower chest pain, epigastric pain with  acute pancreatitis with lipase more than 1400. Admit to hospitalist service for acute pancreatitis. Continue patient on IV fluids, IV pain medications, and Zofran. The patient says he is able to eat so will start with clear liquids and see how he does. He has elevated white count but no fevers. Follow the CBC and start antibiotics if the patient spikes fever or otherwise continues to go high. The patient will get a CAT scan of abdomen and pelvis for evaluation of pancreas and had gallbladder. His LFTs are normal and probably related to EtOH use. The patient does not admit that he drinks heavily. We will see if there are any other causes of intrinsic pancreatic structural abnormalities.   TIME SPENT ON HISTORY AND PHYSICAL: About 55 minutes. Discussed the plan with the patient's mother in detail.   ____________________________ Epifanio Lesches, MD sk:drc D: 01/18/2012 17:08:46 ET T: 01/18/2012 17:33:14 ET JOB#: [id number]  cc: Epifanio Lesches, MD, <Dictator> Richard L. Rosanna Randy, MD Epifanio Lesches MD ELECTRONICALLY SIGNED 02/13/2012 22:44

## 2014-07-31 NOTE — Consult Note (Signed)
Chief Complaint:   Subjective/Chief Complaint Feels better with less abdominal pain.   VITAL SIGNS/ANCILLARY NOTES: **Vital Signs.:   09-Oct-13 14:30   Vital Signs Type Routine   Temperature Temperature (F) 98   Celsius 36.6   Temperature Source Oral   Pulse Pulse 62   Respirations Respirations 18   Systolic BP Systolic BP 941   Diastolic BP (mmHg) Diastolic BP (mmHg) 73   Mean BP 85   Pulse Ox % Pulse Ox % 97   Pulse Ox Activity Level  At rest   Oxygen Delivery Room Air/ 21 %   Brief Assessment:   Additional Physical Exam Abdomen is soft and non tender.   Lab Results: Routine Chem:  09-Oct-13 06:46    Cholesterol, Serum 157   Triglycerides, Serum 90   HDL (INHOUSE) 43   VLDL Cholesterol Calculated 18   LDL Cholesterol Calculated 96 (Result(s) reported on 20 Jan 2012 at 07:52AM.)   Radiology Results: [place]:    09-Oct-13 09:22, US Abdomen General Survey   US Abdomen General Survey    REASON FOR EXAM:    recurrent pancreatitis  COMMENTS:       PROCEDURE: [place]  - US ABDOMEN GENERAL SURVEY  - Jan 20 2012  9:22AM     RESULT: Abdominal sonogram is performed. The pancreas is incompletely   visualized but shows no gross abnormality. The liver shows no discrete   mass. Portal venous flow is normal. There is no ascites. Common bile duct   diameter is 3.6 mm. The right kidney and left kidney appeared normal with   a right kidney length of 13.10 cm x 5.78 x 5.12 cm and the left kidney   12.72 x 5.94 x 6.37 cm. There is no obstruction, mass or stone. The   spleen, proximal inferior vena cava and abdominal aorta appear to be   unremarkable. Common bile duct diameter is 3.6 mm. The gallbladder wall   thickness is normal at 2.0 mm. There is no pericholecystic fluid or   ascites.  IMPRESSION:   1. No evidence of cholelithiasis. Grossly normal abdominal sonogram.    Thank you for the opportunity to contribute to the care of your patient.     Dictation Site: 2          Verified  By: Sundra Aland, M.D., MD   Assessment/Plan:  Assessment/Plan:   Assessment Recurrent pancreatitis, clinically much better. Most likely ETOH related.    Plan Clear liquid diet, advance as tolerated. Will arrange for follow up at discharge.   Electronic Signatures: Jill Side (MD)  (Signed 09-Oct-13 17:05)  Authored: Chief Complaint, VITAL SIGNS/ANCILLARY NOTES, Brief Assessment, Lab Results, Radiology Results, Assessment/Plan   Last Updated: 09-Oct-13 17:05 by Jill Side (MD)

## 2014-08-03 NOTE — Op Note (Signed)
PATIENT NAME:  Nicholas Hobbs, Nicholas Hobbs MR#:  [id number] DATE OF BIRTH:  05-01-1970  DATE OF PROCEDURE:  10/21/2012  PREOPERATIVE DIAGNOSIS: Internal derangement of the left knee.   POSTOPERATIVE DIAGNOSES:  1. Tear of the posterior horn of the medial meniscus, left knee.  2. 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   PROCEDURES PERFORMED: Left knee arthroscopy, partial medial meniscectomy, and medial chondroplasty.   SURGEON: Laurice Record. Holley Bouche., M.D.   ANESTHESIA: General.   ESTIMATED BLOOD LOSS: Minimal.   TOURNIQUET TIME: Not used.   DRAINS: None.   INDICATIONS FOR SURGERY: The patient is a 44 year old gentleman who has been seen for complaints of persistent left knee pain. MRI demonstrated findings consistent with meniscal pathology. After discussion of the risks and benefits of surgical intervention, the patient expressed understanding of the risks and benefits and agreed with plans for surgical intervention.   PROCEDURE IN DETAIL: The patient was brought into the operating room and, after adequate general anesthesia was achieved, a tourniquet was placed on the patient's left thigh and the leg was placed in a leg holder. All bony prominences were well padded. The patient's left knee and leg were cleaned and prepped with alcohol and DuraPrep and draped in the usual sterile fashion. A "timeout" was performed as per usual protocol. The anticipated portal sites were injected with 0.25% Marcaine with epinephrine. An anterolateral portal was created, and cannula was inserted. A small effusion was evacuated. The scope was inserted, and the knee was distended with fluid using the Stryker pump. The scope was advanced down the medial gutter into the medial compartment of the knee. Under visualization with the scope, an anteromedial portal was created and a hook probe was inserted. Inspection of the medial compartment demonstrated a degenerative flap-type lesion involving the  posterior horn. The lesion was debrided using meniscal punches and a 4.5 mm shaver. Final contouring was performed using a 50 degree ArthroCare wand. The anterior horn was visualized and probed and felt to be stable. There were grade 3 to early grade 4 changes of chondromalacia involving both the medial femoral condyle and medial tibial plateau. These areas were debrided and contoured using the ArthroCare wand, with marked improvement of fibrillation to the articular surface. The scope was then advanced into the intercondylar region. The anterior cruciate ligament was visualized and probed and felt to be stable. The scope was removed from the anterolateral portal and reinserted via the anteromedial portal so as to better visualize the lateral compartment. The articular surface was in excellent condition. The lateral meniscus was visualized and probed and felt to be stable. Finally, the scope was positioned so as to visualize the patellofemoral articulation. The articular surface was in good condition. Good patellar tracking was noted.   The knee was irrigated with copious amounts of fluid and then suctioned dry. The anterolateral portal was reapproximated using #3-0 nylon. A combination of 0.25% Marcaine with epinephrine and 4 mg of morphine was injected via the scope. The scope was removed, and the anteromedial portal was reapproximated using #3-0 nylon. A sterile dressing was applied followed by application of ice wrap.   The patient tolerated the procedure well. He was transported to the recovery room in stable condition.   ____________________________ Laurice Record. Holley Bouche., MD jph:gb D: 10/21/2012 15:05:16 ET T: 10/21/2012 21:28:40 ET JOB#: [id number]  cc: Jeneen Rinks P. Holley Bouche., MD, <Dictator> JAMES P Holley Bouche MD ELECTRONICALLY SIGNED 10/22/2012 12:15

## 2014-08-12 NOTE — Op Note (Signed)
PATIENT NAME:  Nicholas Hobbs, Nicholas Hobbs MR#:  [id number] DATE OF BIRTH:  November 05, 1970  DATE OF PROCEDURE:  06/18/2014  PREOPERATIVE DIAGNOSIS: Internal derangement of the left knee.   POSTOPERATIVE DIAGNOSES:  1.  Tear of the anterior and posterior horn medial meniscus, left knee.  2.  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   PROCEDURES PERFORMED: Left knee arthroscopy, partial medial meniscectomy, and medial chondroplasty.   SURGEON: Skip Estimable, MD   ANESTHESIA: General.   ESTIMATED BLOOD LOSS: Minimal.   TOURNIQUET TIME: Not used.   DRAINS: None.   INDICATIONS FOR SURGERY: The patient is a 44 year old male who has been seen for complaints of persistent left knee pain. MRI demonstrated findings consistent with meniscal pathology as well as some irregularity of the articular cartilage and the medial compartment. After discussion of the risks and benefits of surgical intervention, the patient expressed understanding of the risks, benefits, and agreed with plans for surgical intervention.   PROCEDURE IN DETAIL: The patient was brought to the Operating Room and after adequate general anesthesia was achieved, a tourniquet was placed on the patient's left thigh and leg was placed in a leg holder. All bony prominences were well padded. A "timeout" was performed as per usual protocol.  The anticipated portal sites were injected with 0.25% Marcaine with epinephrine.  An anterolateral portal was created and a cannula was inserted. The scope was inserted and the knee was distended with fluid.  The scope was advanced down the medial gutter into the medial compartment of the knee. Under visualization with the scope, an anteromedial portal was created and a hook probe was inserted. Inspection of the medial compartment demonstrated a degenerative tear involving the posterior horn of the medial meniscus. This was debrided using a combination of meniscal punches and a 4.5 mm shaver.  Final  contouring was performed using a 50 degree ArthroCare wand.  The remaining posterior rim was visualized and probed and felt to be stable.  There is also noted to be degenerative tear involving the anterior horn of the medial meniscus. This was debrided using the 4.5 mm shaver and then contoured using the 50 degree ArthroCare wand.  Inspection of the articular cartilage demonstrated an area of grade 4 chondromalacia involving the medial tibial plateau.  The margins were contoured using the ArthroCare wand.  Also of note were grade 3 changes involving the corresponding areas of the medial femoral condyle. These areas were debrided and contoured using the ArthroCare wand.  The scope was then advanced into the intercondylar region. The anterior cruciate ligament was visualized and probed and felt to be stable. The scope was removed from the anterolateral portal and reinserted via the anteromedial portal so as to better visualize the lateral compartment.  The articular surface was in good condition. The lateral meniscus was visualized and probed and felt to be stable. Finally, the scope was positioned so as to visualize the patellofemoral articulation. The articular surface was in good condition. Good patellar tracking was appreciated.   The knee was irrigated and then suctioned dry. The anterolateral portal was reapproximated using 3-0 nylon. A combination of 0.25% Marcaine with epinephrine and 4 mg morphine was injected via the scope. The scope was removed and the anteromedial portal was reapproximated using a #3-0 nylon. A sterile dressing was applied followed by application of an ice wrap. The patient tolerated the procedure well. He was transported to the recovery room in stable condition.     ____________________________ Laurice Record. Hooten  Brooke Bonito., MD jph:DT D: 06/19/2014 00:71:21 ET T: 06/19/2014 16:10:15 ET JOB#: [id number]  cc: Laurice Record. Holley Bouche., MD, <Dictator> Laurice Record Holley Bouche MD ELECTRONICALLY SIGNED  06/30/2014 16:47

## 2014-12-03 ENCOUNTER — Ambulatory Visit (INDEPENDENT_AMBULATORY_CARE_PROVIDER_SITE_OTHER): Payer: Managed Care, Other (non HMO) | Admitting: Family Medicine

## 2014-12-03 ENCOUNTER — Encounter: Payer: Self-pay | Admitting: Family Medicine

## 2014-12-03 VITALS — BP 110/80 | HR 68 | Temp 98.4°F | Resp 16 | Wt 262.4 lb

## 2014-12-03 DIAGNOSIS — J029 Acute pharyngitis, unspecified: Secondary | ICD-10-CM | POA: Diagnosis not present

## 2014-12-03 NOTE — Progress Notes (Signed)
Subjective:     Patient ID: Nicholas Hobbs, male   DOB: May 02, 1970, 44 y.o.   MRN: [id number]  HPI  Chief Complaint  Patient presents with  . Sore Throat    Patient comes in office today with concerns of sore throat since Thursday 8/18. Patient states that on Saturday symptom got worse and he experienced difficulty swallowing liquids and solid foods.   Reports mild runny nose. States his son has been sick as well.   Review of Systems  Constitutional: Negative for fever and chills.  Respiratory: Negative for cough.   Gastrointestinal:       States reflux under control with Prilosec.       Objective:   Physical Exam  Constitutional: He appears well-developed and well-nourished. No distress.  Ears: T.M's intact without inflammation Throat: moderate tonsillar enlargement per baseline. No exudate or erythema Neck: mild cervical node enlargement and tenderness bilaterally Lungs: clear     Assessment:    1. Pharyngitis - POCT rapid strep A    Plan:    Discussed symptomatic treatment.

## 2014-12-03 NOTE — Patient Instructions (Signed)
Discussed use of Mucinex D and Nyquil. Delsym for cough.

## 2015-02-11 ENCOUNTER — Emergency Department: Payer: Managed Care, Other (non HMO)

## 2015-02-11 ENCOUNTER — Emergency Department
Admission: EM | Admit: 2015-02-11 | Discharge: 2015-02-12 | Disposition: A | Discharge status: Home / Self Care | Payer: Managed Care, Other (non HMO) | Attending: Student | Admitting: Student

## 2015-02-11 ENCOUNTER — Encounter: Payer: Self-pay | Admitting: Emergency Medicine

## 2015-02-11 DIAGNOSIS — Z720 Tobacco use: Secondary | ICD-10-CM | POA: Diagnosis not present

## 2015-02-11 DIAGNOSIS — K859 Acute pancreatitis without necrosis or infection, unspecified: Secondary | ICD-10-CM | POA: Diagnosis not present

## 2015-02-11 DIAGNOSIS — R1032 Left lower quadrant pain: Secondary | ICD-10-CM | POA: Diagnosis present

## 2015-02-11 DIAGNOSIS — Z79899 Other long term (current) drug therapy: Secondary | ICD-10-CM | POA: Diagnosis not present

## 2015-02-11 DIAGNOSIS — R1013 Epigastric pain: Secondary | ICD-10-CM

## 2015-02-11 LAB — TROPONIN I

## 2015-02-11 LAB — HEPATIC FUNCTION PANEL
ALT: 15 U/L — AB (ref 17–63)
AST: 19 U/L (ref 15–41)
Albumin: 4.1 g/dL (ref 3.5–5.0)
Alkaline Phosphatase: 54 U/L (ref 38–126)
Total Bilirubin: 1.1 mg/dL (ref 0.3–1.2)
Total Protein: 7.2 g/dL (ref 6.5–8.1)

## 2015-02-11 LAB — BASIC METABOLIC PANEL
Anion gap: 6 (ref 5–15)
BUN: 19 mg/dL (ref 6–20)
CALCIUM: 9 mg/dL (ref 8.9–10.3)
CO2: 29 mmol/L (ref 22–32)
CREATININE: 0.99 mg/dL (ref 0.61–1.24)
Chloride: 106 mmol/L (ref 101–111)
GFR calc Af Amer: >60 mL/min (ref >60)
GFR calc non Af Amer: >60 mL/min (ref >60)
GLUCOSE: 102 mg/dL — AB (ref 65–99)
Potassium: 4.5 mmol/L (ref 3.5–5.1)
Sodium: 141 mmol/L (ref 135–145)

## 2015-02-11 LAB — CBC
HCT: 42.4 % (ref 40.0–52.0)
HEMOGLOBIN: 14.5 g/dL (ref 13.0–18.0)
MCH: 29.8 pg (ref 26.0–34.0)
MCHC: 34.3 g/dL (ref 32.0–36.0)
MCV: 86.9 fL (ref 80.0–100.0)
PLATELETS: 161 10*3/uL (ref 150–440)
RBC: 4.87 MIL/uL (ref 4.40–5.90)
RDW: 13.6 % (ref 11.5–14.5)
WBC: 9.5 10*3/uL (ref 3.8–10.6)

## 2015-02-11 LAB — LIPASE, BLOOD: LIPASE: 110 U/L — AB (ref 11–51)

## 2015-02-11 MED ORDER — MORPHINE SULFATE (PF) 4 MG/ML IV SOLN
4.0000 mg | Freq: Once | INTRAVENOUS | Status: AC
  Administered 2015-02-11: 4 mg via INTRAVENOUS

## 2015-02-11 MED ORDER — ONDANSETRON HCL 4 MG/2ML IJ SOLN
4.0000 mg | Freq: Once | INTRAMUSCULAR | Status: AC
  Administered 2015-02-11: 4 mg via INTRAVENOUS

## 2015-02-11 MED ORDER — ONDANSETRON HCL 4 MG/2ML IJ SOLN
INTRAMUSCULAR | Status: AC
  Administered 2015-02-11: 4 mg via INTRAVENOUS
  Filled 2015-02-11: qty 2

## 2015-02-11 MED ORDER — MORPHINE SULFATE (PF) 4 MG/ML IV SOLN
INTRAVENOUS | Status: AC
  Administered 2015-02-11: 4 mg via INTRAVENOUS
  Filled 2015-02-11: qty 1

## 2015-02-11 MED ORDER — SODIUM CHLORIDE 0.9 % IV BOLUS (SEPSIS)
1000.0000 mL | Freq: Once | INTRAVENOUS | Status: AC
  Administered 2015-02-11: 1000 mL via INTRAVENOUS

## 2015-02-11 NOTE — ED Notes (Signed)
Pt presents to ED with c/o of abdominal pain and epigastric discomfort that started Saturday night. Pt states that he has a strong history of recurrent pancreatitis and this is normal pattern of symptoms. Pain is 5/10.

## 2015-02-11 NOTE — ED Provider Notes (Signed)
[facility] Emergency Department Provider Note  ____________________________________________  Time seen: Approximately 9:37 PM  I have reviewed the triage vital signs and the nursing notes.   HISTORY  Chief Complaint Abdominal Pain; Pancreatitis; and Chest Pain    HPI Nicholas Hobbs is a 44 y.o. male with history of GERD, recurrent pancreatitis of unclear etiology who presents for evaluation of epigastric abdominal pain, gradual onset, intermittent since last night which feels similar to his usual flares of pancreatitis. She'll, vomiting, diarrhea, fevers or chills. He denies any chest pain or difficulty breathing. Reports he briefly had some similar abdominal pain 3 nights ago however it went away and came back last night. Currently his symptoms are moderate to severe. There are no modifying factors. He has had extensive outpatient workup for recurrent pancreatitis without any clear answer. He denies any alcohol use. No history of coronary artery disease, no personal or family history of early coronary artery disease, PE or DVT.   Past Medical History  Diagnosis Date  . GERD (gastroesophageal reflux disease)     Patient Active Problem List   Diagnosis Date Noted  . Current tear knee, medial meniscus 05/08/2014  . Arthritis of knee, degenerative 05/08/2014  . PANCREATITIS 04/30/2008  . SLEEP APNEA 04/30/2008    Past Surgical History  Procedure Laterality Date  . Knee surgery Right 06/2014    meniscus repair    Current Outpatient Rx  Name  Route  Sig  Dispense  Refill  . Ascorbic Acid (VITAMIN C) 1000 MG tablet   Oral   Take 500 mg by mouth daily.          Marland Kitchen omeprazole (PRILOSEC) 40 MG capsule   Oral   Take 40 mg by mouth daily.            Allergies Cat hair extract and Clarithromycin  Family History  Problem Relation Age of Onset  . Seizures Father     Social History Social History  Substance Use Topics  . Smoking status:  Current Some Day Smoker    Types: Cigarettes  . Smokeless tobacco: None  . Alcohol Use: None    Review of Systems Constitutional: No fever/chills Eyes: No visual changes. ENT: No sore throat. Cardiovascular: Denies chest pain. Respiratory: Denies shortness of breath. Gastrointestinal: + abdominal pain.  No nausea, no vomiting.  No diarrhea.  No constipation. Genitourinary: Negative for dysuria. Musculoskeletal: Negative for back pain. Skin: Negative for rash. Neurological: Negative for headaches, focal weakness or numbness.  10-point ROS otherwise negative.  ____________________________________________   PHYSICAL EXAM:  VITAL SIGNS: ED Triage Vitals  Enc Vitals Group     BP 02/11/15 2048 118/67 mmHg     Pulse Rate 02/11/15 2048 63     Resp 02/11/15 2048 18     Temp 02/11/15 2048 98.3 F (36.8 C)     Temp Source 02/11/15 2048 Oral     SpO2 02/11/15 2048 96 %     Weight 02/11/15 2048 259 lb (117.482 kg)     Height 02/11/15 2048 6\' 4"  (1.93 m)     Head Cir --      Peak Flow --      Pain Score 02/11/15 2101 7     Pain Loc --      Pain Edu? --      Excl. in [place]? --     Constitutional: Alert and oriented. Well appearing and in no acute distress. Eyes: Conjunctivae are normal. PERRL. EOMI. Head: Atraumatic. Nose:  No congestion/rhinnorhea. Mouth/Throat: Mucous membranes are moist.  Oropharynx non-erythematous. Neck: No stridor.   Cardiovascular: Normal rate, regular rhythm. Grossly normal heart sounds.  Good peripheral circulation. Respiratory: Normal respiratory effort.  No retractions. Lungs CTAB. Gastrointestinal: Normal bowel sounds. Soft with moderate tenderness in the epigastrium as well as the left lower quadrant. No CVA tenderness. Genitourinary: deferred Musculoskeletal: No lower extremity tenderness nor edema.  No joint effusions. Neurologic:  Normal speech and language. No gross focal neurologic deficits are appreciated. No gait instability. Skin:  Skin is  warm, dry and intact. No rash noted. Psychiatric: Mood and affect are normal. Speech and behavior are normal.  ____________________________________________   LABS (all labs ordered are listed, but only abnormal results are displayed)  Labs Reviewed  BASIC METABOLIC PANEL - Abnormal; Notable for the following:    Glucose, Bld 102 (*)    All other components within normal limits  HEPATIC FUNCTION PANEL - Abnormal; Notable for the following:    ALT 15 (*)    Bilirubin, Direct <0.1 (*)    All other components within normal limits  LIPASE, BLOOD - Abnormal; Notable for the following:    Lipase 110 (*)    All other components within normal limits  CBC  TROPONIN I   ____________________________________________  EKG  ED ECG REPORT I, Joanne Gavel, the attending physician, personally viewed and interpreted this ECG.   Date: 02/11/2015  EKG Time: 20:45  Rate: 64  Rhythm: normal EKG, normal sinus rhythm  Axis: normal  Intervals:none  ST&T Change: No acute ST elevation  ____________________________________________  RADIOLOGY  CXR IMPRESSION: No active disease.    RUQ ultrasound ____________________________________________   PROCEDURES  Procedure(s) performed: None  Critical Care performed: No  ____________________________________________   INITIAL IMPRESSION / ASSESSMENT AND PLAN / ED COURSE  Pertinent labs & imaging results that were available during my care of the patient were reviewed by me and considered in my medical decision making (see chart for details).  Nicholas Hobbs is a 44 y.o. male with history of GERD, recurrent pancreatitis of unclear etiology who presents for evaluation of epigastric abdominal pain, gradual onset, intermittent since last night which feels similar to his usual flares of pancreatitis. On exam, he is really well-appearing and in no acute distress. Vital signs stable, he is afebrile. He does have some mild tender to palpation in  the epigastrium as well as mild tender to palpation in the left lower quadrant. No rebound, no guarding. EKG reassuring, troponin negative, not consistent with ACS. Labs reviewed. CBC and CMP unremarkable. Lipase elevated at 110. Suspect recurrent pancreatitis, cause unknown. Awaiting right upper quadrant ultrasound to evaluate for any gallstones. We'll treat his pain, reassess for disposition. Additionally, he does have mild tenderness in the left lower quadrant which may represent early diverticulitis but in the absence of leukocytosis, fever, distention I do not think he requires advanced imaging at this time.  ----------------------------------------- 11:11 PM on 02/11/2015 -----------------------------------------  Right upper quadrant ultrasound pending. Care transferred to Dr. Owens Shark at this time pending results as well as final disposition. If the patient is to be discharged, would recommend treatment with Augmentin for possible early diverticulitis. ____________________________________________   FINAL CLINICAL IMPRESSION(S) / ED DIAGNOSES  Final diagnoses:  Epigastric pain  Acute pancreatitis, unspecified pancreatitis type      Joanne Gavel, MD 02/11/15 2312

## 2015-02-11 NOTE — ED Notes (Signed)
Patient transported to X-ray 

## 2015-02-12 LAB — TRIGLYCERIDES: TRIGLYCERIDES: 100 mg/dL (ref <150)

## 2015-02-12 MED ORDER — ONDANSETRON 4 MG PO TBDP: 4.0000 mg | ORAL_TABLET | Freq: Three times a day (TID) | ORAL | Status: DC | PRN

## 2015-02-12 MED ORDER — OXYCODONE-ACETAMINOPHEN 5-325 MG PO TABS: 1.0000 | ORAL_TABLET | ORAL | Status: DC | PRN

## 2015-02-12 NOTE — Discharge Instructions (Signed)

## 2015-02-12 NOTE — ED Provider Notes (Signed)
I assumed care of the patient from Dr. Edd Fabian 11:00 PM ultrasound of the right upper quadrant was negative. Spoke with the patient who stated that his pain was resolved no active vomiting at this time or nausea. Encouraged patient to return to the emergency department immediately if pain uncontrolled with home medications as well as any nausea vomiting or fever patient and his mother and agrees with discharge plan with plan to follow-up with Dr. Rosanna Randy in 2 days.  Gregor Hams, MD 02/12/15 [phone]

## 2015-03-14 ENCOUNTER — Other Ambulatory Visit: Payer: Self-pay | Admitting: Family Medicine

## 2015-07-15 ENCOUNTER — Ambulatory Visit (INDEPENDENT_AMBULATORY_CARE_PROVIDER_SITE_OTHER): Payer: Managed Care, Other (non HMO) | Admitting: Family Medicine

## 2015-07-15 ENCOUNTER — Encounter: Payer: Self-pay | Admitting: Family Medicine

## 2015-07-15 VITALS — BP 124/80 | HR 58 | Temp 97.8°F | Resp 16 | Wt 269.2 lb

## 2015-07-15 DIAGNOSIS — H6982 Other specified disorders of Eustachian tube, left ear: Secondary | ICD-10-CM

## 2015-07-15 DIAGNOSIS — H9312 Tinnitus, left ear: Secondary | ICD-10-CM

## 2015-07-15 DIAGNOSIS — F524 Premature ejaculation: Secondary | ICD-10-CM | POA: Diagnosis not present

## 2015-07-15 DIAGNOSIS — N529 Male erectile dysfunction, unspecified: Secondary | ICD-10-CM | POA: Insufficient documentation

## 2015-07-15 MED ORDER — PAROXETINE HCL 10 MG PO TABS: 10.0000 mg | ORAL_TABLET | Freq: Every day | ORAL | Status: DC

## 2015-07-15 MED ORDER — FLUTICASONE PROPIONATE 50 MCG/ACT NA SUSP: 2.0000 | Freq: Every day | NASAL | Status: DC

## 2015-07-15 NOTE — Progress Notes (Signed)
Subjective:     Patient ID: Nicholas Hobbs, male   DOB: Aug 30, 1970, 45 y.o.   MRN: [id number]  HPI  Chief Complaint  Patient presents with  . Tinnitus    Patient comes in office today with concerns of ear pain on the left side since Saturday 04/1. Patient states that it felt like his ear was stopped up and was unable to pop, patient reports ringing has remained constant. Patient denies any cold/uri symptoms.   Reports when he pushes inside his ear he can hear better but the ringing remains. Denies excessive noise exposure prior to the onset of his sx. Also wants to know how to treat premature ejaculation. States sildenafil @ 40-60 mg.works well for his E.D.. Reports he has one partner. Has tried drinking alcohol prior to intercourse with modest improvement.   Review of Systems     Objective:   Physical Exam  Constitutional: He appears well-developed and well-nourished. No distress.  HENT:  Right TM intact without inflammation Left TM with distorted light reflex without inflammation. No tragal tenderness.       Assessment:    1. Tinnitus, left  2. Eustachian tube dysfunction, left - fluticasone (FLONASE) 50 MCG/ACT nasal spray; Place 2 sprays into both nostrils daily.  Dispense: 16 g; Refill: 0  3. Erectile dysfunction, unspecified erectile dysfunction type  4. Generalized mild acquired premature ejaculation - PARoxetine (PAXIL) 10 MG tablet; Take 1 tablet (10 mg total) by mouth daily.  Dispense: 30 tablet; Refill: 0    Plan:    Consider ENT referral if ear not improving. Phone f/u as needed.

## 2015-07-15 NOTE — Patient Instructions (Addendum)
Will call regarding whether treatment plan helps.

## 2015-12-31 ENCOUNTER — Emergency Department
Admission: EM | Admit: 2015-12-31 | Discharge: 2015-12-31 | Disposition: A | Discharge status: Home / Self Care | Payer: Managed Care, Other (non HMO) | Attending: Emergency Medicine | Admitting: Emergency Medicine

## 2015-12-31 ENCOUNTER — Encounter: Payer: Self-pay | Admitting: Emergency Medicine

## 2015-12-31 ENCOUNTER — Emergency Department: Payer: Managed Care, Other (non HMO)

## 2015-12-31 DIAGNOSIS — N451 Epididymitis: Secondary | ICD-10-CM

## 2015-12-31 DIAGNOSIS — N50812 Left testicular pain: Secondary | ICD-10-CM | POA: Diagnosis present

## 2015-12-31 DIAGNOSIS — F17220 Nicotine dependence, chewing tobacco, uncomplicated: Secondary | ICD-10-CM | POA: Insufficient documentation

## 2015-12-31 HISTORY — DX: Acute pancreatitis without necrosis or infection, unspecified: K85.90

## 2015-12-31 MED ORDER — OXYCODONE-ACETAMINOPHEN 5-325 MG PO TABS: 1.0000 | ORAL_TABLET | Freq: Four times a day (QID) | ORAL | 0 refills | Status: DC | PRN

## 2015-12-31 MED ORDER — NAPROXEN 500 MG PO TABS: 500.0000 mg | ORAL_TABLET | Freq: Two times a day (BID) | ORAL | 0 refills | Status: DC

## 2015-12-31 MED ORDER — CIPROFLOXACIN HCL 500 MG PO TABS: 500.0000 mg | ORAL_TABLET | Freq: Two times a day (BID) | ORAL | 0 refills | Status: DC

## 2015-12-31 NOTE — ED Triage Notes (Signed)
C/O pain to left testicle.  Started yesterday.  Initially pain improved, but pain has worsened today.  Pain worse with sitting or bending. 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

## 2015-12-31 NOTE — ED Notes (Signed)
Pt reports increased pain to L testicle/L hip area.  Pt does state that he has had some frequency without pain during urination.  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  Pt reports that he has a history of torsion when he was 74 or 17, but doesn't remember which testicle was affected at that time. Pt denies n/v and denies any recent heavy lifting.  Pt reports pancreatitis 5 times and has had appendicitis with appendectomy in the past.

## 2015-12-31 NOTE — ED Provider Notes (Signed)
[facility] Emergency Department Provider Note  ____________________________________________  Time seen: Approximately 9:23 PM  I have reviewed the triage vital signs and the nursing notes.   HISTORY  Chief Complaint Testicle Pain    HPI Nicholas Hobbs is a 45 y.o. male who complains of left testicular pain over the last 2 days. No trauma. Has a history of right testicular torsion about 30 years ago that was surgically repaired. Pain is worse with standing, better with sitting, worse with movement and genital contact on seeds and other surfaces. No dysuria frequency urgency. No fevers or chills. No penile discharge or genital lesions.     Past Medical History:  Diagnosis Date  . GERD (gastroesophageal reflux disease)   . Pancreatitis      Patient Active Problem List   Diagnosis Date Noted  . Erectile dysfunction 07/15/2015  . Current tear knee, medial meniscus 05/08/2014  . Arthritis of knee, degenerative 05/08/2014  . PANCREATITIS 04/30/2008  . SLEEP APNEA 04/30/2008     Past Surgical History:  Procedure Laterality Date  . KNEE SURGERY Right 06/2014   meniscus repair     Prior to Admission medications   Medication Sig Start Date End Date Taking? Authorizing Provider  Ascorbic Acid (VITAMIN C) 1000 MG tablet Take 500 mg by mouth daily.     Historical Provider, MD  ciprofloxacin (CIPRO) 500 MG tablet Take 1 tablet (500 mg total) by mouth 2 (two) times daily. 12/31/15   Carrie Mew, MD  fluticasone [facility]) 50 MCG/ACT nasal spray Place 2 sprays into both nostrils daily. 07/15/15   Carmon Ginsberg, PA  Multiple Vitamins-Minerals (MENS ONE DAILY PO) Take by mouth.    Historical Provider, MD  naproxen (NAPROSYN) 500 MG tablet Take 1 tablet (500 mg total) by mouth 2 (two) times daily with a meal. 12/31/15   Carrie Mew, MD  omeprazole (PRILOSEC) 40 MG capsule Take 40 mg by mouth daily.     Historical Provider, MD  oxyCODONE-acetaminophen  (ROXICET) 5-325 MG tablet Take 1 tablet by mouth every 6 (six) hours as needed for severe pain. 12/31/15   Carrie Mew, MD  PARoxetine (PAXIL) 10 MG tablet Take 1 tablet (10 mg total) by mouth daily. 07/15/15   Carmon Ginsberg, PA  sildenafil (REVATIO) 20 MG tablet TAKE 2-5 TABLETS BY MOUTH ONCE DAILY AS NEEDED FOR ERECTILE DYSFUNCTION 03/14/15   Carmon Ginsberg, PA     Allergies Cat hair extract and Clarithromycin   Family History  Problem Relation Age of Onset  . Seizures Father     Social History Social History  Substance Use Topics  . Smoking status: Never Smoker  . Smokeless tobacco: Current User    Types: Chew  . Alcohol use Not on file    Review of Systems  Constitutional:   No fever or chills.  Gastrointestinal:   Negative for abdominal pain, vomiting and diarrhea.  Genitourinary:   Negative for dysuria or difficulty urinating. Musculoskeletal:   Negative for focal pain or swelling  10-point ROS otherwise negative.  ____________________________________________   PHYSICAL EXAM:  VITAL SIGNS: ED Triage Vitals  Enc Vitals Group     BP 12/31/15 1854 114/74     Pulse Rate 12/31/15 1854 69     Resp 12/31/15 1854 18     Temp 12/31/15 1854 98.2 F (36.8 C)     Temp Source 12/31/15 1854 Oral     SpO2 12/31/15 1854 96 %     Weight 12/31/15 1856 265 lb (120.2  kg)     Height 12/31/15 1856 6\' 4"  (1.93 m)     Head Circumference --      Peak Flow --      Pain Score 12/31/15 1856 0     Pain Loc --      Pain Edu? --      Excl. in [place]? --     Vital signs reviewed, nursing assessments reviewed.   Constitutional:   Alert and oriented. Well appearing and in no distress. Eyes:   No scleral icterus.  EOMI.  Gastrointestinal:   Soft and nontender. Non distended. There is no CVA tenderness.  No rebound, rigidity, or guarding. Genitourinary:   Normal genitalia, circumcised. No lesions. No discharge. No scrotal swelling but there is epididymal tenderness on the left. No  indirect or direct hernia, including Valsalva maneuver while standing. Musculoskeletal:   Nontender with normal range of motion in all extremities. No joint effusions.  No lower extremity tenderness.  No edema. Neurologic:   Normal speech and language.  CN 2-10 normal. Motor grossly intact. No gross focal neurologic deficits are appreciated.    ____________________________________________    LABS (pertinent positives/negatives) (all labs ordered are listed, but only abnormal results are displayed) Labs Reviewed - No data to display ____________________________________________   EKG    ____________________________________________    RADIOLOGY  Ultrasound scrotum unremarkable. No evidence of torsion.  ____________________________________________   PROCEDURES Procedures  ____________________________________________   INITIAL IMPRESSION / ASSESSMENT AND PLAN / ED COURSE  Pertinent labs & imaging results that were available during my care of the patient were reviewed by me and considered in my medical decision making (see chart for details).  Patient well appearing no acute distress, presents with isolated left testicle pain. Exam is consistent with idiopathic epididymitis. Start the patient on ciprofloxacin, follow up with primary care. Very short course of Percocet, advised to take primarily NSAIDs for pain management.     Clinical Course   ____________________________________________   FINAL CLINICAL IMPRESSION(S) / ED DIAGNOSES  Final diagnoses:  Epididymitis, left       Portions of this note were generated with dragon dictation software. Dictation errors may occur despite best attempts at proofreading.    Carrie Mew, MD 12/31/15 2127

## 2016-06-08 ENCOUNTER — Ambulatory Visit: Payer: Self-pay | Admitting: Podiatry

## 2016-07-31 ENCOUNTER — Emergency Department: Payer: Managed Care, Other (non HMO)

## 2016-07-31 ENCOUNTER — Emergency Department
Admission: EM | Admit: 2016-07-31 | Discharge: 2016-07-31 | Disposition: A | Discharge status: Home / Self Care | Payer: Managed Care, Other (non HMO) | Attending: Emergency Medicine | Admitting: Emergency Medicine

## 2016-07-31 ENCOUNTER — Encounter: Payer: Self-pay | Admitting: Emergency Medicine

## 2016-07-31 DIAGNOSIS — K2900 Acute gastritis without bleeding: Secondary | ICD-10-CM | POA: Diagnosis not present

## 2016-07-31 DIAGNOSIS — R0602 Shortness of breath: Secondary | ICD-10-CM | POA: Diagnosis present

## 2016-07-31 DIAGNOSIS — Z79899 Other long term (current) drug therapy: Secondary | ICD-10-CM | POA: Insufficient documentation

## 2016-07-31 LAB — HEPATIC FUNCTION PANEL
ALT: 24 U/L (ref 17–63)
AST: 27 U/L (ref 15–41)
Albumin: 4.3 g/dL (ref 3.5–5.0)
Alkaline Phosphatase: 57 U/L (ref 38–126)
BILIRUBIN DIRECT: 0.2 mg/dL (ref 0.1–0.5)
BILIRUBIN INDIRECT: 2.1 mg/dL — AB (ref 0.3–0.9)
BILIRUBIN TOTAL: 2.3 mg/dL — AB (ref 0.3–1.2)
Total Protein: 7.7 g/dL (ref 6.5–8.1)

## 2016-07-31 LAB — BASIC METABOLIC PANEL
Anion gap: 6 (ref 5–15)
BUN: 18 mg/dL (ref 6–20)
CALCIUM: 9 mg/dL (ref 8.9–10.3)
CO2: 26 mmol/L (ref 22–32)
Chloride: 106 mmol/L (ref 101–111)
Creatinine, Ser: 0.98 mg/dL (ref 0.61–1.24)
GFR calc Af Amer: >60 mL/min (ref >60)
GFR calc non Af Amer: >60 mL/min (ref >60)
GLUCOSE: 99 mg/dL (ref 65–99)
POTASSIUM: 4.1 mmol/L (ref 3.5–5.1)
Sodium: 138 mmol/L (ref 135–145)

## 2016-07-31 LAB — CBC
HEMATOCRIT: 47.6 % (ref 40.0–52.0)
Hemoglobin: 16.2 g/dL (ref 13.0–18.0)
MCH: 29.2 pg (ref 26.0–34.0)
MCHC: 34 g/dL (ref 32.0–36.0)
MCV: 85.9 fL (ref 80.0–100.0)
Platelets: 191 10*3/uL (ref 150–440)
RBC: 5.54 MIL/uL (ref 4.40–5.90)
RDW: 14.1 % (ref 11.5–14.5)
WBC: 11.9 10*3/uL — ABNORMAL HIGH (ref 3.8–10.6)

## 2016-07-31 LAB — TROPONIN I: Troponin I: <0.03 ng/mL (ref <0.03)

## 2016-07-31 LAB — LIPASE, BLOOD: LIPASE: 24 U/L (ref 11–51)

## 2016-07-31 MED ORDER — MORPHINE SULFATE (PF) 4 MG/ML IV SOLN
INTRAVENOUS | Status: AC
  Administered 2016-07-31: 4 mg via INTRAVENOUS
  Filled 2016-07-31: qty 1

## 2016-07-31 MED ORDER — ONDANSETRON HCL 4 MG/2ML IJ SOLN
INTRAMUSCULAR | Status: AC
  Administered 2016-07-31: 4 mg via INTRAVENOUS
  Filled 2016-07-31: qty 2

## 2016-07-31 MED ORDER — ONDANSETRON HCL 4 MG/2ML IJ SOLN
4.0000 mg | Freq: Once | INTRAMUSCULAR | Status: AC
  Administered 2016-07-31: 4 mg via INTRAVENOUS

## 2016-07-31 MED ORDER — MORPHINE SULFATE (PF) 4 MG/ML IV SOLN
4.0000 mg | Freq: Once | INTRAVENOUS | Status: AC
Start: 2016-07-31 — End: 2016-07-31
  Administered 2016-07-31: 4 mg via INTRAVENOUS

## 2016-07-31 MED ORDER — MORPHINE SULFATE (PF) 4 MG/ML IV SOLN
4.0000 mg | Freq: Once | INTRAVENOUS | Status: AC
  Administered 2016-07-31: 4 mg via INTRAVENOUS

## 2016-07-31 MED ORDER — GI COCKTAIL ~~LOC~~
30.0000 mL | Freq: Once | ORAL | Status: AC
  Administered 2016-07-31: 30 mL via ORAL
  Filled 2016-07-31: qty 30

## 2016-07-31 MED ORDER — IOPAMIDOL (ISOVUE-370) INJECTION 76%
75.0000 mL | Freq: Once | INTRAVENOUS | Status: AC | PRN
  Administered 2016-07-31: 75 mL via INTRAVENOUS

## 2016-07-31 MED ORDER — SUCRALFATE 1 G PO TABS: 1.0000 g | ORAL_TABLET | Freq: Four times a day (QID) | ORAL | 0 refills | Status: DC

## 2016-07-31 MED ORDER — SODIUM CHLORIDE 0.9 % IV SOLN
1000.0000 mL | Freq: Once | INTRAVENOUS | Status: AC
  Administered 2016-07-31: 1000 mL via INTRAVENOUS

## 2016-07-31 NOTE — ED Triage Notes (Signed)
Pt ambulatory to triage in NAD, report CP and SOB x 2 days, hx of pancreatitis.

## 2016-07-31 NOTE — ED Provider Notes (Signed)
[facility] Emergency Department Provider Note   ____________________________________________    I have reviewed the triage vital signs and the nursing notes.   HISTORY  Chief Complaint Abdominal pain    HPI Nicholas Hobbs is a 46 y.o. male presents with complaints of epigastric abdominal pain that is moderate. He reports he took 2 oxycodone yesterday but it did not seem help significantly.he reports a history of pancreatitis in the past and this feels somewhat similar. He denies feverrs or chills. He denies cough. He denies chest pain to me. No nausea or vomiting. Normal stools   Past Medical History:  Diagnosis Date  . GERD (gastroesophageal reflux disease)   . Pancreatitis     Patient Active Problem List   Diagnosis Date Noted  . Erectile dysfunction 07/15/2015  . Current tear knee, medial meniscus 05/08/2014  . Arthritis of knee, degenerative 05/08/2014  . PANCREATITIS 04/30/2008  . SLEEP APNEA 04/30/2008    Past Surgical History:  Procedure Laterality Date  . KNEE SURGERY Right 06/2014   meniscus repair    Prior to Admission medications   Medication Sig Start Date End Date Taking? Authorizing Provider  Ascorbic Acid (VITAMIN C) 1000 MG tablet Take 500 mg by mouth daily.    Yes Historical Provider, MD  cetirizine (ZYRTEC) 10 MG tablet Take 10 mg by mouth daily as needed for allergies.   Yes Historical Provider, MD  omeprazole (PRILOSEC) 40 MG capsule Take 40 mg by mouth daily.    Yes Historical Provider, MD  oxyCODONE-acetaminophen (ROXICET) 5-325 MG tablet Take 1 tablet by mouth every 6 (six) hours as needed for severe pain. 12/31/15  Yes Carrie Mew, MD  sildenafil (REVATIO) 20 MG tablet TAKE 2-5 TABLETS BY MOUTH ONCE DAILY AS NEEDED FOR ERECTILE DYSFUNCTION 03/14/15  Yes Carmon Ginsberg, PA  fluticasone (FLONASE) 50 MCG/ACT nasal spray Place 2 sprays into both nostrils daily. Patient not taking: Reported on 07/31/2016 07/15/15    Carmon Ginsberg, PA  naproxen (NAPROSYN) 500 MG tablet Take 1 tablet (500 mg total) by mouth 2 (two) times daily with a meal. Patient not taking: Reported on 07/31/2016 12/31/15   Carrie Mew, MD  PARoxetine (PAXIL) 10 MG tablet Take 1 tablet (10 mg total) by mouth daily. Patient not taking: Reported on 07/31/2016 07/15/15   Carmon Ginsberg, PA  sucralfate (CARAFATE) 1 g tablet Take 1 tablet (1 g total) by mouth 4 (four) times daily. 07/31/16 08/15/16  Lavonia Drafts, MD     Allergies Cat hair extract and Clarithromycin  Family History  Problem Relation Age of Onset  . Seizures Father     Social History Social History  Substance Use Topics  . Smoking status: Never Smoker  . Smokeless tobacco: Current User    Types: Chew  . Alcohol use 0.0 oz/week    Review of Systems  Constitutional: No fever/chills  ENT: No sore throat. Cardiovascular: Denies chest pain. Respiratory: mild shortness of breath Gastrointestinal: as above Genitourinary: Negative for dysuria. Musculoskeletal: Negative for back pain. Skin: Negative for rash. Neurological: Negative for headaches  10-point ROS otherwise negative.  ____________________________________________   PHYSICAL EXAM:  VITAL SIGNS: ED Triage Vitals  Enc Vitals Group     BP 07/31/16 0658 108/68     Pulse Rate 07/31/16 0658 80     Resp 07/31/16 0658 16     Temp 07/31/16 0658 99.1 F (37.3 C)     Temp Source 07/31/16 0658 Oral     SpO2 07/31/16 5397  98 %     Weight 07/31/16 0659 275 lb (124.7 kg)     Height 07/31/16 0659 6\' 4"  (1.93 m)     Head Circumference --      Peak Flow --      Pain Score 07/31/16 0653 7     Pain Loc --      Pain Edu? --      Excl. in [place]? --    Constitutional: Alert and oriented. No acute distress. Pleasant and interactive Eyes: Conjunctivae are normal.   Nose: No congestion/rhinnorhea. Mouth/Throat: Mucous membranes are moist.    Cardiovascular: Normal rate, regular rhythm. Grossly normal heart  sounds.  Good peripheral circulation. Respiratory: Normal respiratory effort.  No retractions. Lungs CTAB. Gastrointestinal: Mild epigastric ttp. No distention.  No CVA tenderness. Genitourinary: deferred Musculoskeletal: No lower extremity tenderness nor edema.  Warm and well perfused Neurologic:  Normal speech and language. No gross focal neurologic deficits are appreciated.  Skin:  Skin is warm, dry and intact. No rash noted. Psychiatric: Mood and affect are normal. Speech and behavior are normal.  ____________________________________________   LABS (all labs ordered are listed, but only abnormal results are displayed)  Labs Reviewed  CBC - Abnormal; Notable for the following:       Result Value   WBC 11.9 (*)    All other components within normal limits  HEPATIC FUNCTION PANEL - Abnormal; Notable for the following:    Total Bilirubin 2.3 (*)    Indirect Bilirubin 2.1 (*)    All other components within normal limits  BASIC METABOLIC PANEL  TROPONIN I  LIPASE, BLOOD   ____________________________________________  EKG  ED ECG REPORT I, Lavonia Drafts, the attending physician, personally viewed and interpreted this ECG.  Date: 07/31/2016 EKG Time: 6:56 AM Rate: 77 Rhythm: normal sinus rhythm QRS Axis: normal Intervals: normal ST/T Wave abnormalities: normal Conduction Disturbances: none Narrative Interpretation: unremarkable  ____________________________________________  RADIOLOGY  Chest x-ray normal ____________________________________________   PROCEDURES  Procedure(s) performed: No    Critical Care performed: No ____________________________________________   INITIAL IMPRESSION / ASSESSMENT AND PLAN / ED COURSE  Pertinent labs & imaging results that were available during my care of the patient were reviewed by me and considered in my medical decision making (see chart for details).  Patient presents with primarily epigastric abdominal pain.  Differential includes pancreatitis given his history, gastritis, PUD, cholecystitis, less likely Colitis/ACS. \   Patient felt better after IV morphine/zofran will continue to monitor. Labs reassuring. EKG normal. Trop negative.   ----------------------------------------- 10:30 AM on 07/31/2016 -----------------------------------------  Patient is complaining of back pain at this time and his upper back, centrally. He states this is unusual for him. 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  CT angiography normal   I suspect gastritis/PUD. Discussed with patient at length about the likely need for egd. He would prefer outpatient approach. I think this is reasonable. Will rx carafate and have him follow up closely with GI. He knows to return if any change in his sx's.     ____________________________________________   FINAL CLINICAL IMPRESSION(S) / ED DIAGNOSES  Final diagnoses:  Acute gastritis without hemorrhage, unspecified gastritis type      NEW MEDICATIONS STARTED DURING THIS VISIT:  Discharge Medication List as of 07/31/2016 12:09 PM    START taking these medications   Details  sucralfate (CARAFATE) 1 g tablet Take 1 tablet (1 g total) by mouth 4 (four) times daily., Starting  Fri 07/31/2016, Until Sat 08/15/2016, Print         Note:  This document was prepared using Dragon voice recognition software and may include unintentional dictation errors.    Lavonia Drafts, MD 07/31/16 [phone]

## 2016-07-31 NOTE — ED Notes (Signed)
Pt states that he can get someone to come pick him up if we give him morphine

## 2016-08-05 ENCOUNTER — Telehealth: Payer: Self-pay

## 2016-08-05 ENCOUNTER — Other Ambulatory Visit: Payer: Self-pay

## 2016-08-05 ENCOUNTER — Encounter: Payer: Self-pay | Admitting: Gastroenterology

## 2016-08-05 ENCOUNTER — Ambulatory Visit (INDEPENDENT_AMBULATORY_CARE_PROVIDER_SITE_OTHER): Payer: Managed Care, Other (non HMO) | Admitting: Gastroenterology

## 2016-08-05 VITALS — BP 115/80 | HR 65 | Temp 97.3°F | Resp 16 | Ht 76.0 in | Wt 279.0 lb

## 2016-08-05 DIAGNOSIS — K219 Gastro-esophageal reflux disease without esophagitis: Secondary | ICD-10-CM

## 2016-08-05 DIAGNOSIS — R1013 Epigastric pain: Secondary | ICD-10-CM | POA: Diagnosis not present

## 2016-08-05 DIAGNOSIS — R1310 Dysphagia, unspecified: Secondary | ICD-10-CM | POA: Diagnosis not present

## 2016-08-05 DIAGNOSIS — K861 Other chronic pancreatitis: Secondary | ICD-10-CM | POA: Diagnosis not present

## 2016-08-05 DIAGNOSIS — R1319 Other dysphagia: Secondary | ICD-10-CM

## 2016-08-05 DIAGNOSIS — K859 Acute pancreatitis without necrosis or infection, unspecified: Secondary | ICD-10-CM

## 2016-08-05 NOTE — Progress Notes (Signed)
Gastroenterology Consultation  Referring Provider:     Jerrol Banana.,* Primary Care Physician:  Wilhemena Durie, MD Primary Gastroenterologist:  Dr. Jonathon Bellows  Reason for Consultation:     Abdominal pain         HPI:   Nicholas Hobbs is a 46 y.o. y/o male referred for consultation & management  by Dr. Wilhemena Durie, MD.     Summary of past history and review of medical records:   He has been referred for non cardiac chest pain. I reviewed his records and it appears he was seen at the ER on 07/31/16 for epigastric pain ,Labs including Lipase were normal except for an elevated indirect bilirubin.On this ER visit he underwent a CT chest angio which was negative for a pumonary embolism. He was discharged and advised to follow up with GI.   He has had a history of recurrent pancreatitis in the past and I was at the  ER visit for the same in 01/2015 .  RUQ USG in 2016 showed no gall stones. He has been evaluated in 2013 by [facility] for pancreatitis. 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 He did develop post ERCP pancreatitis too. IGG4 was not elevated in 2006. LFT's been normal in the past as well. In 2006 he had an EUS which showed grade 4 esophagitis . EGD in 2006 showed esophagitis as well as .   He says that he is here today for an EGD.   He says that last Wednesday after lunch he got a severe stomach pain "in the middle of the stomach ": lasted for several hours and disappeared, recurred the next day , took some oxycodone that would take it away , helped it but he woke up the next day and decided to go to the ER as the pain persisted. When he had pancreatitis in the past the location of the pain was different. Denies any NSAID use. No issues swallowing . He has been burping a lot in the last few weeks. He chews sugar free gum  upto 5 sticks a day . Consumes 2 water mixers a day .   No pain presently. Since 2013 he had an episode of pancreatitis in 2015, 2017 , he says he was admitted at that time in [place].Never had an MRI of his pancreas. No family history of pancreatitis or pancreatic cancer. No celiac disease in the family. His sisters have had their   He takes omeprazole - on and off . Last time took it this morning . Prior to going to the Er was taking his omperazole. Occasional drink of alcohol, no smoking . Some difficulty swallowing too.   Past Medical History:  Diagnosis Date  . GERD (gastroesophageal reflux disease)   . Pancreatitis     Past Surgical History:  Procedure Laterality Date  . KNEE SURGERY Right 06/2014   meniscus repair    Prior to Admission medications   Medication Sig Start Date End Date Taking? Authorizing Provider  Ascorbic Acid (VITAMIN C) 1000 MG tablet Take 500 mg by mouth daily.     Historical Provider, MD  cetirizine (ZYRTEC) 10 MG tablet Take 10 mg by mouth daily as needed for allergies.    Historical Provider, MD  fluticasone (FLONASE) 50 MCG/ACT nasal spray Place 2 sprays into both nostrils daily.  Patient not taking: Reported on 07/31/2016 07/15/15   Carmon Ginsberg, PA  naproxen (NAPROSYN) 500 MG tablet Take 1 tablet (500 mg total) by mouth 2 (two) times daily with a meal. Patient not taking: Reported on 07/31/2016 12/31/15   Carrie Mew, MD  omeprazole (PRILOSEC) 40 MG capsule Take 40 mg by mouth daily.     Historical Provider, MD  oxyCODONE-acetaminophen (ROXICET) 5-325 MG tablet Take 1 tablet by mouth every 6 (six) hours as needed for severe pain. 12/31/15   Carrie Mew, MD  PARoxetine (PAXIL) 10 MG tablet Take 1 tablet (10 mg total) by mouth daily. Patient not taking: Reported on 07/31/2016 07/15/15   Carmon Ginsberg, PA  sildenafil (REVATIO) 20 MG tablet TAKE 2-5 TABLETS BY MOUTH ONCE DAILY AS NEEDED FOR ERECTILE DYSFUNCTION 03/14/15   Carmon Ginsberg, PA  sucralfate  (CARAFATE) 1 g tablet Take 1 tablet (1 g total) by mouth 4 (four) times daily. 07/31/16 08/15/16  Lavonia Drafts, MD    Family History  Problem Relation Age of Onset  . Seizures Father      Social History  Substance Use Topics  . Smoking status: Never Smoker  . Smokeless tobacco: Current User    Types: Chew  . Alcohol use 0.0 oz/week    Allergies as of 08/05/2016 - Review Complete 08/05/2016  Allergen Reaction Noted  . Cat hair extract Other (See Comments) 12/03/2014  . Clarithromycin Itching     Review of Systems:    All systems reviewed and negative except where noted in HPI.   Physical Exam:  BP 115/80 (BP Location: Right Arm, Patient Position: Sitting, Cuff Size: Large)   Pulse 65   Temp 97.3 F (36.3 C) (Oral)   Resp 16   Ht 6\' 4"  (1.93 m)   Wt 279 lb (126.6 kg)   BMI 33.96 kg/m  No LMP for male patient. Psych:  Alert and cooperative. Normal mood and affect. General:   Alert,  Well-developed, well-nourished, pleasant and cooperative in NAD Head:  Normocephalic and atraumatic. Eyes:  Sclera clear, no icterus.   Conjunctiva pink. Ears:  Normal auditory acuity. Nose:  No deformity, discharge, or lesions. Mouth:  No deformity or lesions,oropharynx pink & moist. Neck:  Supple; no masses or thyromegaly. Lungs:  Respirations even and unlabored.  Clear throughout to auscultation.   No wheezes, crackles, or rhonchi. No acute distress. Heart:  Regular rate and rhythm; no murmurs, clicks, rubs, or gallops. Abdomen:  Normal bowel sounds.  No bruits.  Soft, non-tender and non-distended without masses, hepatosplenomegaly or hernias noted.  No guarding or rebound tenderness.    Msk:  Symmetrical without gross deformities. Good, equal movement & strength bilaterally. Neurologic:  Alert and oriented x3;  grossly normal neurologically. Psych:  Alert and cooperative. Normal mood and affect.  Imaging Studies: Dg Chest 2 View  Result Date: 07/31/2016 CLINICAL DATA:  Chest pain,  shortness of breath x2 days EXAM: CHEST  2 VIEW COMPARISON:  None. FINDINGS: Lungs are clear.  No pleural effusion or pneumothorax. The heart is normal in size. Visualized osseous structures are within normal limits. IMPRESSION: Normal chest radiographs. Electronically Signed   By: Julian Hy M.D.   On: 07/31/2016 07:28   Ct Angio Chest Pe W And/or Wo Contrast  Result Date: 07/31/2016 CLINICAL DATA:  report CP and SOB x 2 days, hx of pancreatitis. No hx of CA or surg. EXAM: CT ANGIOGRAPHY CHEST WITH CONTRAST TECHNIQUE: Multidetector CT imaging of the chest was performed using the standard protocol  during bolus administration of intravenous contrast. Multiplanar CT image reconstructions and MIPs were obtained to evaluate the vascular anatomy. CONTRAST:  75 mL of Isovue 370 intravenous contrast COMPARISON:  Current chest radiograph FINDINGS: Cardiovascular: Satisfactory opacification of the pulmonary arteries to the segmental level. No evidence of pulmonary embolism. Normal heart size. No pericardial effusion. Mild coronary artery calcifications. The aorta is normal in caliber. No dissection. No atherosclerosis. Mediastinum/Nodes: No enlarged mediastinal, hilar, or axillary lymph nodes. Thyroid gland, trachea, and esophagus demonstrate no significant findings. Lungs/Pleura: Mild dependent lower lobe subsegmental atelectasis. 4 mm pleural-based nodule in the right lower lobe, image 52, series 6. Lungs otherwise clear. No pleural effusion or pneumothorax. Upper Abdomen: No acute abnormality. Musculoskeletal: Subchondral cystic change and irregularity along the inferior left glenoid. No fracture or acute finding. No osteoblastic or osteolytic lesions. Review of the MIP images confirms the above findings. IMPRESSION: 1. No evidence of a pulmonary embolism.  No acute findings. 2. 4 mm right lower lobe nodule. No follow-up needed if patient is low-risk. Non-contrast chest CT can be considered in 12 months if  patient is high-risk. This recommendation follows the consensus statement: Guidelines for Management of Incidental Pulmonary Nodules Detected on CT Images: From the Fleischner Society 2017; Radiology 2017; 284:228-243. Electronically Signed   By: Lajean Manes M.D.   On: 07/31/2016 11:13    Assessment and Plan:   EVIN LOISEAU is a 46 y.o. y/o male has been referred for abdominal pain . He has a history of recurrent pancreatitis, severe esophagitis, GERD. He has 2 issues I sense at this time   1. Recent abdominal pain likely secondary to reflux 2. Recurrent pancreatitis with further episodes despite ERCP and pancreatic duct stenting in 2013- am concerned if the stricture has recurred.   Plan  1. Check IIG4 levels, celiac serology  2. MRCP pancreas to rule pancreatic duct stricture , pancreatic divisum.  3. EGD to r/o esophagitis 4. GERD Counseled on life style changes, suggest to use PPI first thing in the morning on empty stomach and eat 30 minutes after. Advised on the use of a wedge pillow at night , avoid meals for 2 hours prior to bed time. Weight loss   I have discussed alternative options, risks & benefits,  which include, but are not limited to, bleeding, infection, perforation,respiratory complication & drug reaction.  The patient agrees with this plan & written consent will be obtained.     Follow up in 6 weeks    Dr Jonathon Bellows MD

## 2016-08-05 NOTE — Telephone Encounter (Signed)
Spoke with Coletta Memos with [employer]. Per insurance plan, no authorization is required for MR abdomen MRCP w/wo contrast ([id number]). 08/05/16 @3 :02pm.

## 2016-08-06 ENCOUNTER — Other Ambulatory Visit: Payer: Self-pay

## 2016-08-06 ENCOUNTER — Telehealth: Payer: Self-pay | Admitting: Gastroenterology

## 2016-08-06 MED ORDER — DICYCLOMINE HCL 10 MG PO CAPS: 10.0000 mg | ORAL_CAPSULE | Freq: Three times a day (TID) | ORAL | 2 refills | Status: DC

## 2016-08-06 NOTE — Telephone Encounter (Signed)
Patient Nicholas Hobbs and is still have stomach pains. He needs some medication to help him until his procedure. Please call patient.

## 2016-08-06 NOTE — Telephone Encounter (Signed)
Advised patient of Bentyl prescription.   Pt states carafate gave him a headache.

## 2016-08-10 ENCOUNTER — Telehealth: Payer: Self-pay | Admitting: Gastroenterology

## 2016-08-10 NOTE — Telephone Encounter (Signed)
08/10/16 Faxed Prior Auth form to [employer].

## 2016-08-17 ENCOUNTER — Other Ambulatory Visit: Payer: Self-pay | Admitting: Gastroenterology

## 2016-08-17 ENCOUNTER — Ambulatory Visit
Admission: RE | Admit: 2016-08-17 | Discharge: 2016-08-17 | Disposition: A | Discharge status: Home / Self Care | Payer: Managed Care, Other (non HMO) | Source: Ambulatory Visit | Attending: Gastroenterology | Admitting: Gastroenterology

## 2016-08-17 ENCOUNTER — Telehealth: Payer: Self-pay

## 2016-08-17 DIAGNOSIS — K219 Gastro-esophageal reflux disease without esophagitis: Secondary | ICD-10-CM | POA: Diagnosis present

## 2016-08-17 MED ORDER — GADOBENATE DIMEGLUMINE 529 MG/ML IV SOLN
20.0000 mL | Freq: Once | INTRAVENOUS | Status: AC | PRN
  Administered 2016-08-17: 20 mL via INTRAVENOUS

## 2016-08-17 NOTE — Telephone Encounter (Signed)
-----   Message from Jonathon Bellows, MD sent at 08/17/2016  9:59 AM EDT ----- Normal MRI

## 2016-08-17 NOTE — Telephone Encounter (Signed)
Advised patient of results per Dr. Vicente Males.   Normal MRI.

## 2016-08-28 ENCOUNTER — Ambulatory Visit
Admission: RE | Admit: 2016-08-28 | Discharge: 2016-08-28 | Disposition: A | Discharge status: Home / Self Care | Payer: Managed Care, Other (non HMO) | Source: Ambulatory Visit | Attending: Gastroenterology | Admitting: Gastroenterology

## 2016-08-28 ENCOUNTER — Ambulatory Visit: Payer: Managed Care, Other (non HMO) | Admitting: Anesthesiology

## 2016-08-28 ENCOUNTER — Encounter
Admission: RE | Disposition: A | Discharge status: Home / Self Care | Payer: Self-pay | Source: Ambulatory Visit | Attending: Gastroenterology

## 2016-08-28 ENCOUNTER — Encounter: Payer: Self-pay | Admitting: Anesthesiology

## 2016-08-28 DIAGNOSIS — K210 Gastro-esophageal reflux disease with esophagitis: Secondary | ICD-10-CM | POA: Diagnosis not present

## 2016-08-28 DIAGNOSIS — Z79899 Other long term (current) drug therapy: Secondary | ICD-10-CM | POA: Insufficient documentation

## 2016-08-28 DIAGNOSIS — K2970 Gastritis, unspecified, without bleeding: Secondary | ICD-10-CM | POA: Diagnosis not present

## 2016-08-28 DIAGNOSIS — R1310 Dysphagia, unspecified: Secondary | ICD-10-CM | POA: Diagnosis not present

## 2016-08-28 DIAGNOSIS — Z7952 Long term (current) use of systemic steroids: Secondary | ICD-10-CM | POA: Diagnosis not present

## 2016-08-28 DIAGNOSIS — Z79891 Long term (current) use of opiate analgesic: Secondary | ICD-10-CM | POA: Insufficient documentation

## 2016-08-28 DIAGNOSIS — K2950 Unspecified chronic gastritis without bleeding: Secondary | ICD-10-CM | POA: Insufficient documentation

## 2016-08-28 DIAGNOSIS — M1990 Unspecified osteoarthritis, unspecified site: Secondary | ICD-10-CM | POA: Diagnosis not present

## 2016-08-28 DIAGNOSIS — K219 Gastro-esophageal reflux disease without esophagitis: Secondary | ICD-10-CM | POA: Diagnosis not present

## 2016-08-28 DIAGNOSIS — K3189 Other diseases of stomach and duodenum: Secondary | ICD-10-CM | POA: Diagnosis not present

## 2016-08-28 HISTORY — PX: ESOPHAGOGASTRODUODENOSCOPY (EGD) WITH PROPOFOL: SHX5813

## 2016-08-28 SURGERY — ESOPHAGOGASTRODUODENOSCOPY (EGD) WITH PROPOFOL
Anesthesia: General

## 2016-08-28 MED ORDER — ONDANSETRON HCL 4 MG/2ML IJ SOLN: 4.0000 mg | Freq: Once | INTRAMUSCULAR | Status: DC | PRN

## 2016-08-28 MED ORDER — GLYCOPYRROLATE 0.2 MG/ML IJ SOLN
INTRAMUSCULAR | Status: DC | PRN
  Administered 2016-08-28: 0.2 mg via INTRAVENOUS

## 2016-08-28 MED ORDER — PROPOFOL 500 MG/50ML IV EMUL
INTRAVENOUS | Status: DC | PRN
  Administered 2016-08-28: 175 ug/kg/min via INTRAVENOUS

## 2016-08-28 MED ORDER — GLYCOPYRROLATE 0.2 MG/ML IJ SOLN
INTRAMUSCULAR | Status: AC
Start: 2016-08-28 — End: 2016-08-28
  Filled 2016-08-28: qty 1

## 2016-08-28 MED ORDER — LIDOCAINE HCL 2 % IJ SOLN
INTRAMUSCULAR | Status: AC
Start: 2016-08-28 — End: 2016-08-28
  Filled 2016-08-28: qty 10

## 2016-08-28 MED ORDER — MIDAZOLAM HCL 2 MG/2ML IJ SOLN
INTRAMUSCULAR | Status: AC
  Filled 2016-08-28: qty 2

## 2016-08-28 MED ORDER — PROPOFOL 10 MG/ML IV BOLUS
INTRAVENOUS | Status: DC | PRN
  Administered 2016-08-28: 100 mg via INTRAVENOUS

## 2016-08-28 MED ORDER — LIDOCAINE HCL (CARDIAC) 20 MG/ML IV SOLN
INTRAVENOUS | Status: DC | PRN
  Administered 2016-08-28: 40 mg via INTRAVENOUS

## 2016-08-28 MED ORDER — FENTANYL CITRATE (PF) 100 MCG/2ML IJ SOLN: 25.0000 ug | INTRAMUSCULAR | Status: DC | PRN

## 2016-08-28 MED ORDER — SODIUM CHLORIDE 0.9 % IV SOLN
INTRAVENOUS | Status: DC
  Administered 2016-08-28: 1000 mL via INTRAVENOUS

## 2016-08-28 MED ORDER — SODIUM CHLORIDE 0.9 % IV SOLN: INTRAVENOUS | Status: DC

## 2016-08-28 MED ORDER — MIDAZOLAM HCL 2 MG/2ML IJ SOLN
INTRAMUSCULAR | Status: DC | PRN
  Administered 2016-08-28: 2 mg via INTRAVENOUS

## 2016-08-28 MED ORDER — PROPOFOL 500 MG/50ML IV EMUL
INTRAVENOUS | Status: AC
  Filled 2016-08-28: qty 50

## 2016-08-28 NOTE — Op Note (Signed)
[facility] Gastroenterology Patient Name: Nicholas Hobbs Procedure Date: 08/28/2016 8:38 AM MRN: [id number] Account #: [id number] Date of Birth: Aug 13, 1970 Admit Type: Outpatient Age: 46 Room: [facility] ENDO ROOM 4 Gender: Male Note Status: Finalized Procedure:            Upper GI endoscopy Indications:          Epigastric abdominal pain, Dysphagia Providers:            Jonathon Bellows MD, MD Referring MD:         Janine Ores. Rosanna Randy, MD (Referring MD) Medicines:            Monitored Anesthesia Care Complications:        No immediate complications. Procedure:            Pre-Anesthesia Assessment:                       - Prior to the procedure, a History and Physical was                        performed, and patient medications, allergies and                        sensitivities were reviewed. The patient's tolerance of                        previous anesthesia was reviewed.                       - The risks and benefits of the procedure and the                        sedation options and risks were discussed with the                        patient. All questions were answered and informed                        consent was obtained.                       - ASA Grade Assessment: II - A patient with mild                        systemic disease.                       After obtaining informed consent, the endoscope was                        passed under direct vision. Throughout the procedure,                        the patient's blood pressure, pulse, and oxygen                        saturations were monitored continuously. The Endoscope                        was introduced through the mouth, and advanced to the  third part of duodenum. The upper GI endoscopy was                        accomplished with ease. The patient tolerated the                        procedure well. Findings:      Localized nodular mucosa was found in the duodenal bulb.  Biopsies were       taken with a cold forceps for histology.      Bilious fluid was found in the gastric body.      Diffuse moderate inflammation characterized by congestion (edema),       erythema and aphthous ulcerations was found in the gastric antrum.       Biopsies were taken with a cold forceps for histology.      LA Grade B (one or more mucosal breaks greater than 5 mm, not extending       between the tops of two mucosal folds) esophagitis with no bleeding was       found 35 to 45 cm from the incisors.      Bile was found in the lower third of the esophagus. Impression:           - Nodular mucosa in the duodenal bulb. Biopsied.                       - Bilious gastric fluid.                       - Gastritis. Biopsied.                       - LA Grade B reflux esophagitis.                       - Bile in the lower third of the esophagus. Recommendation:       - Discharge patient to home (with escort).                       - Advance diet as tolerated.                       - Continue present medications.                       - Await pathology results.                       - Return to my office in 6 weeks.                       - If dysphagia persists depsite PPI use then will need                        to have repeat EGD to check for healing of esophagitis,                        R/o EOE +/- manometry. Large amount of bile in the                        esophagus suggests acid and non acid reflux and can  cause esophageal dysmotility Procedure Code(s):    --- Professional ---                       [phone], Esophagogastroduodenoscopy, flexible, transoral;                        with biopsy, single or multiple Diagnosis Code(s):    --- Professional ---                       K31.89, Other diseases of stomach and duodenum                       K29.70, Gastritis, unspecified, without bleeding                       K21.0, Gastro-esophageal reflux disease with  esophagitis                       R10.13, Epigastric pain                       R13.10, Dysphagia, unspecified CPT copyright 2016 American Medical Association. All rights reserved. The codes documented in this report are preliminary and upon coder review may  be revised to meet current compliance requirements. Jonathon Bellows, MD Jonathon Bellows MD, MD 08/28/2016 8:51:38 AM This report has been signed electronically. Number of Addenda: 0 Note Initiated On: 08/28/2016 8:38 AM      [facility]

## 2016-08-28 NOTE — Transfer of Care (Signed)
Immediate Anesthesia Transfer of Care Note  Patient: Nicholas Hobbs  Procedure(s) Performed: Procedure(s): ESOPHAGOGASTRODUODENOSCOPY (EGD) WITH PROPOFOL WITH DILATION (N/A)  Patient Location: PACU and Endoscopy Unit  Anesthesia Type:General  Level of Consciousness: sedated  Airway & Oxygen Therapy: Patient Spontanous Breathing and Patient connected to nasal cannula oxygen  Post-op Assessment: Report given to RN and Post -op Vital signs reviewed and stable  Post vital signs: Reviewed and stable  Last Vitals:  Vitals:   08/28/16 0719 08/28/16 0855  BP: 120/90 (!) 91/53  Pulse: 66 67  Resp: 20 15  Temp: (!) 35.6 C (!) 16.0 C    Complications: No apparent anesthesia complications

## 2016-08-28 NOTE — H&P (Signed)
Jonathon Bellows MD [address] [place],  [address] Phone: [phone] Fax : [phone]  Primary Care Physician:  Jerrol Banana., MD Primary Gastroenterologist:  Dr. Jonathon Bellows   Pre-Procedure History & Physical: HPI:  Nicholas Hobbs is a 46 y.o. male is here for an endoscopy.   Past Medical History:  Diagnosis Date  . GERD (gastroesophageal reflux disease)   . Pancreatitis     Past Surgical History:  Procedure Laterality Date  . KNEE SURGERY Right 06/2014   meniscus repair    Prior to Admission medications   Medication Sig Start Date End Date Taking? Authorizing Provider  Ascorbic Acid (VITAMIN C) 1000 MG tablet Take 500 mg by mouth daily.    Yes [provider]  cetirizine (ZYRTEC) 10 MG tablet Take 10 mg by mouth daily as needed for allergies.   Yes [provider]  dicyclomine (BENTYL) 10 MG capsule Take 1 capsule (10 mg total) by mouth 4 (four) times daily -  before meals and at bedtime. 08/06/16  Yes Jonathon Bellows, MD  fluticasone [facility]) 50 MCG/ACT nasal spray Place 2 sprays into both nostrils daily. 07/15/15  Yes Carmon Ginsberg, PA  naproxen (NAPROSYN) 500 MG tablet Take 1 tablet (500 mg total) by mouth 2 (two) times daily with a meal. 12/31/15  Yes Carrie Mew, MD  omeprazole (PRILOSEC) 40 MG capsule Take 40 mg by mouth daily.    Yes [provider]  oxyCODONE-acetaminophen (ROXICET) 5-325 MG tablet Take 1 tablet by mouth every 6 (six) hours as needed for severe pain. 12/31/15  Yes Carrie Mew, MD  PARoxetine (PAXIL) 10 MG tablet Take 1 tablet (10 mg total) by mouth daily. 07/15/15  Yes Carmon Ginsberg, PA  sildenafil (REVATIO) 20 MG tablet TAKE 2-5 TABLETS BY MOUTH ONCE DAILY AS NEEDED FOR ERECTILE DYSFUNCTION 03/14/15  Yes Carmon Ginsberg, PA  sucralfate (CARAFATE) 1 g tablet Take 1 tablet (1 g total) by mouth 4 (four) times daily. 07/31/16 08/15/16  Lavonia Drafts, MD    Allergies as of 08/05/2016 - Review  Complete 08/05/2016  Allergen Reaction Noted  . Cat hair extract Other (See Comments) 12/03/2014  . Clarithromycin Itching     Family History  Problem Relation Age of Onset  . Seizures Father     Social History   Social History  . Marital status: Single    Spouse name: N/A  . Number of children: N/A  . Years of education: N/A   Occupational History  . Not on file.   Social History Main Topics  . Smoking status: Never Smoker  . Smokeless tobacco: Current User    Types: Chew  . Alcohol use 0.0 oz/week  . Drug use: Unknown  . Sexual activity: Not on file   Other Topics Concern  . Not on file   Social History Narrative  . No narrative on file    Review of Systems: See HPI, otherwise negative ROS  Physical Exam: BP 120/90   Pulse 66   Temp (!) 96 F (35.6 C)   Resp 20   Ht 6\' 4"  (1.93 m)   Wt 275 lb (124.7 kg)   BMI 33.47 kg/m  General:   Alert,  pleasant and cooperative in NAD Head:  Normocephalic and atraumatic. Neck:  Supple; no masses or thyromegaly. Lungs:  Clear throughout to auscultation.    Heart:  Regular rate and rhythm. Abdomen:  Soft, nontender and nondistended. Normal bowel sounds, without guarding, and without rebound.   Neurologic:  Alert and  oriented x4;  grossly normal neurologically.  Impression/Plan: Nicholas Hobbs is here for an endoscopy to be performed for dysphagia and abdominal pain   Risks, benefits, limitations, and alternatives regarding  endoscopy have been reviewed with the patient.  Questions have been answered.  All parties agreeable.   Jonathon Bellows, MD  08/28/2016, 7:41 AM

## 2016-08-28 NOTE — Anesthesia Procedure Notes (Signed)
Date/Time: 08/28/2016 8:39 AM Performed by: Doreen Salvage Pre-anesthesia Checklist: Patient identified, Emergency Drugs available, Suction available and Patient being monitored Patient Re-evaluated:Patient Re-evaluated prior to inductionOxygen Delivery Method: Nasal cannula Intubation Type: IV induction Dental Injury: Teeth and Oropharynx as per pre-operative assessment  Comments: Nasal cannula with etCO2 monitoring

## 2016-08-28 NOTE — Anesthesia Postprocedure Evaluation (Signed)
Anesthesia Post Note  Patient: RAHKEEM SENFT  Procedure(s) Performed: Procedure(s) (LRB): ESOPHAGOGASTRODUODENOSCOPY (EGD) WITH PROPOFOL WITH DILATION (N/A)  Patient location during evaluation: PACU Anesthesia Type: General Level of consciousness: awake and alert and oriented Pain management: pain level controlled Vital Signs Assessment: post-procedure vital signs reviewed and stable Respiratory status: spontaneous breathing Cardiovascular status: blood pressure returned to baseline Anesthetic complications: no     Last Vitals:  Vitals:   08/28/16 0925 08/28/16 0935  BP: 109/75 105/74  Pulse: 62 (!) 54  Resp: 13 14  Temp:      Last Pain:  Vitals:   08/28/16 0857  TempSrc: Tympanic  PainSc:                  Lexianna Weinrich

## 2016-08-28 NOTE — Anesthesia Preprocedure Evaluation (Signed)
Anesthesia Evaluation  Patient identified by MRN, date of birth, ID band Patient awake    Reviewed: Allergy & Precautions, NPO status , Patient's Chart, lab work & pertinent test results  Airway Mallampati: II       Dental   Pulmonary    Pulmonary exam normal        Cardiovascular negative cardio ROS Normal cardiovascular exam     Neuro/Psych    GI/Hepatic Neg liver ROS, GERD  Medicated,  Endo/Other    Renal/GU negative Renal ROS  negative genitourinary   Musculoskeletal  (+) Arthritis , Osteoarthritis,    Abdominal Normal abdominal exam  (+)   Peds negative pediatric ROS (+)  Hematology negative hematology ROS (+)   Anesthesia Other Findings Past Medical History: No date: GERD (gastroesophageal reflux disease) No date: Pancreatitis  Reproductive/Obstetrics                             Anesthesia Physical Anesthesia Plan  ASA: II  Anesthesia Plan: General   Post-op Pain Management:    Induction: Intravenous  Airway Management Planned: Nasal Cannula  Additional Equipment:   Intra-op Plan:   Post-operative Plan:   Informed Consent: I have reviewed the patients History and Physical, chart, labs and discussed the procedure including the risks, benefits and alternatives for the proposed anesthesia with the patient or authorized representative who has indicated his/her understanding and acceptance.     Plan Discussed with: CRNA and Surgeon  Anesthesia Plan Comments:         Anesthesia Quick Evaluation

## 2016-08-28 NOTE — Anesthesia Post-op Follow-up Note (Cosign Needed)
Anesthesia QCDR form completed.        

## 2016-08-31 LAB — SURGICAL PATHOLOGY

## 2016-09-01 ENCOUNTER — Encounter: Payer: Self-pay | Admitting: Gastroenterology

## 2016-09-03 ENCOUNTER — Encounter: Payer: Self-pay | Admitting: Gastroenterology

## 2016-09-03 ENCOUNTER — Telehealth: Payer: Self-pay

## 2016-09-03 NOTE — Telephone Encounter (Signed)
Advised patient of results per Dr. Vicente Males.   1. Biopsies from EGD show duodentitis and gastritis which can also increase lipase but not indicate pancreatitis   2. Pancreas on MRI normal\   3. Blood tests-seems he didn't obtain   Advised patient about NSAIDS. Sent Dr. Vicente Males a note requesting treatment of duodenitis and gastritis per Pt.

## 2016-09-03 NOTE — Telephone Encounter (Signed)
-----   Message from Jonathon Bellows, MD sent at 09/03/2016 12:39 PM EDT ----- Regarding: RE: Results 1. Biopsies from EGD show duodentitis and gastritis which can also increase lipase but not indicate pancreatitis  2. Pancreas on MRI normal\  3. Blood tests-seems he didn't obtain     ----- Message ----- From: Leontine Locket, CMA Sent: 09/03/2016   9:59 AM To: Jonathon Bellows, MD Subject: Results                                        Results please

## 2016-09-11 ENCOUNTER — Other Ambulatory Visit: Payer: Self-pay

## 2017-02-16 ENCOUNTER — Other Ambulatory Visit: Payer: Self-pay | Admitting: Family Medicine

## 2017-07-17 ENCOUNTER — Ambulatory Visit
Admission: EM | Admit: 2017-07-17 | Discharge: 2017-07-17 | Disposition: A | Discharge status: Other Acute Inpatient Hospital | Payer: Managed Care, Other (non HMO) | Attending: Emergency Medicine | Admitting: Emergency Medicine

## 2017-07-17 ENCOUNTER — Other Ambulatory Visit: Payer: Self-pay

## 2017-07-17 DIAGNOSIS — S3991XA Unspecified injury of abdomen, initial encounter: Secondary | ICD-10-CM | POA: Diagnosis not present

## 2017-07-17 DIAGNOSIS — S298XXA Other specified injuries of thorax, initial encounter: Secondary | ICD-10-CM

## 2017-07-17 MED ORDER — SODIUM CHLORIDE 0.9 % IJ SOLN
5.00 | INTRAMUSCULAR | Status: DC
Start: 2017-07-18 — End: 2017-07-17

## 2017-07-17 MED ORDER — SODIUM CHLORIDE 0.9 % IV BOLUS
1000.0000 mL | Freq: Once | INTRAVENOUS | Status: AC
  Administered 2017-07-17: 1000 mL via INTRAVENOUS
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
## 2017-07-18 MED ORDER — OXYCODONE HCL 5 MG PO TABS
5.00 | ORAL_TABLET | ORAL | Status: DC
Start: ? — End: 2017-07-18

## 2017-07-18 MED ORDER — HEPARIN SODIUM (PORCINE) 5000 UNIT/ML IJ SOLN
5000.00 | INTRAMUSCULAR | Status: DC
Start: 2017-07-18 — End: 2017-07-18

## 2017-07-18 MED ORDER — HYDROMORPHONE HCL 1 MG/ML IJ SOLN
0.50 | INTRAMUSCULAR | Status: DC
Start: ? — End: 2017-07-18

## 2017-07-18 MED ORDER — ACETAMINOPHEN 325 MG PO TABS
325.00 | ORAL_TABLET | ORAL | Status: DC
Start: ? — End: 2017-07-18

## 2017-07-18 MED ORDER — LIDOCAINE 5 % EX PTCH
1.00 | MEDICATED_PATCH | CUTANEOUS | Status: DC
Start: 2017-07-18 — End: 2017-07-18

## 2017-07-21 DIAGNOSIS — M25511 Pain in right shoulder: Secondary | ICD-10-CM | POA: Insufficient documentation

## 2017-07-21 DIAGNOSIS — S42109A Fracture of unspecified part of scapula, unspecified shoulder, initial encounter for closed fracture: Secondary | ICD-10-CM | POA: Insufficient documentation

## 2017-08-09 ENCOUNTER — Encounter: Payer: Self-pay | Admitting: Family Medicine

## 2017-08-09 ENCOUNTER — Ambulatory Visit
Admission: RE | Admit: 2017-08-09 | Discharge: 2017-08-09 | Disposition: A | Discharge status: Home / Self Care | Payer: Managed Care, Other (non HMO) | Source: Ambulatory Visit | Attending: Family Medicine | Admitting: Family Medicine

## 2017-08-09 ENCOUNTER — Ambulatory Visit (INDEPENDENT_AMBULATORY_CARE_PROVIDER_SITE_OTHER): Payer: Managed Care, Other (non HMO) | Admitting: Family Medicine

## 2017-08-09 ENCOUNTER — Telehealth: Payer: Self-pay | Admitting: Family Medicine

## 2017-08-09 ENCOUNTER — Other Ambulatory Visit: Payer: Self-pay | Admitting: Family Medicine

## 2017-08-09 VITALS — BP 118/76 | HR 86 | Temp 98.1°F | Resp 16 | Ht 76.0 in | Wt 276.4 lb

## 2017-08-09 DIAGNOSIS — R0789 Other chest pain: Secondary | ICD-10-CM

## 2017-08-09 DIAGNOSIS — X58XXXA Exposure to other specified factors, initial encounter: Secondary | ICD-10-CM | POA: Insufficient documentation

## 2017-08-09 DIAGNOSIS — S2242XA Multiple fractures of ribs, left side, initial encounter for closed fracture: Secondary | ICD-10-CM | POA: Insufficient documentation

## 2017-08-09 NOTE — Telephone Encounter (Signed)
Received [employer] disability benefits for. Form was giving to provider. Thanks CC

## 2017-08-09 NOTE — Patient Instructions (Signed)
We will call you with the x-ray report. Take pain medication along with deep breathing.
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
## 2017-08-10 ENCOUNTER — Telehealth: Payer: Self-pay | Admitting: Family Medicine

## 2017-08-10 NOTE — Telephone Encounter (Signed)
Only need f/u x-ray if still painful. Probably too early to show x-ray signs of healing.

## 2017-08-10 NOTE — Telephone Encounter (Signed)
Faxed [employer] form and ov 08-09-17  to [phone].  Thanks CC

## 2017-08-10 NOTE — Telephone Encounter (Signed)
Left detailed voicemail
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
## 2017-08-16 ENCOUNTER — Telehealth: Payer: Self-pay | Admitting: Family Medicine

## 2017-08-16 NOTE — Telephone Encounter (Signed)
Was in last Monday for xrays.  Paperwork was sent back to his employer stating he should return to work after following up with his orthopedic dr.    He has questions regarding his return to work  Pt''s call back  is [phone]    Thanks teri
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
## 2017-08-17 NOTE — Telephone Encounter (Signed)
Discussed further f/u with me after orthopedic visit if needed.

## 2017-09-02 ENCOUNTER — Ambulatory Visit (INDEPENDENT_AMBULATORY_CARE_PROVIDER_SITE_OTHER): Payer: Managed Care, Other (non HMO) | Admitting: Podiatry

## 2017-09-02 ENCOUNTER — Encounter

## 2017-09-02 ENCOUNTER — Encounter: Payer: Self-pay | Admitting: Podiatry

## 2017-09-02 ENCOUNTER — Telehealth: Payer: Self-pay | Admitting: Podiatry

## 2017-09-02 VITALS — BP 113/68 | HR 68 | Resp 16

## 2017-09-02 DIAGNOSIS — L600 Ingrowing nail: Secondary | ICD-10-CM | POA: Diagnosis not present

## 2017-09-02 MED ORDER — DOXYCYCLINE HYCLATE 100 MG PO CAPS: 100.0000 mg | ORAL_CAPSULE | Freq: Two times a day (BID) | ORAL | 0 refills | Status: DC

## 2017-09-02 MED ORDER — NEOMYCIN-POLYMYXIN-HC 1 % OT SOLN: OTIC | 1 refills | Status: DC

## 2017-09-02 NOTE — Telephone Encounter (Signed)
I had an appointment with Dr. Milinda Pointer this morning. He was supposed to call in a prescription for me to [facility] on [address] in [place] for an antibiotic and some drops. I went by [facility] and they had not received a call for any antibiotic at all. I was calling to see if somebody could please call in the prescription I need for my toe. If you would please call me back on my cell phone at [phone] to let me know the medication has been prescribed. Thank you.

## 2017-09-02 NOTE — Progress Notes (Signed)
Subjective:  Patient ID: Nicholas Hobbs, male    DOB: January 14, 1971,  MRN: [id number] HPI Chief Complaint  Patient presents with  . Toe Pain    Hallux left - medial border, tender x 1 week, red and swollen, no treatment  . Skin Problem    Lower legs bilateral - rash since Monday, was outside weedeating while guys were mulching trees in yard, thought was poison oak/ivy, but doesn't itch or hurt, wiping down with alcohol  . New Patient (Initial Visit)    47 y.o. male presents with the above complaint.   ROS: Denies fever chills nausea vomiting muscle aches pains calf pain back pain chest pain shortness of breath.    Past Medical History:  Diagnosis Date  . GERD (gastroesophageal reflux disease)   . Pancreatitis   . Rib fracture   . Shoulder fracture, right    Past Surgical History:  Procedure Laterality Date  . ESOPHAGOGASTRODUODENOSCOPY (EGD) WITH PROPOFOL N/A 08/28/2016   Procedure: ESOPHAGOGASTRODUODENOSCOPY (EGD) WITH PROPOFOL WITH DILATION;  Surgeon: Jonathon Bellows, MD;  Location: [facility] ENDOSCOPY;  Service: Endoscopy;  Laterality: N/A;  . KNEE SURGERY Right 06/2014   meniscus repair    Current Outpatient Medications:  .  Ascorbic Acid (VITAMIN C) 1000 MG tablet, Take 500 mg by mouth daily. , Disp: , Rfl:  .  cetirizine (ZYRTEC) 10 MG tablet, Take 10 mg by mouth daily as needed for allergies., Disp: , Rfl:  .  naproxen (NAPROSYN) 500 MG tablet, Take 1 tablet (500 mg total) by mouth 2 (two) times daily with a meal., Disp: 20 tablet, Rfl: 0 .  omeprazole (PRILOSEC) 40 MG capsule, Take 40 mg by mouth daily. , Disp: , Rfl:  .  oxyCODONE-acetaminophen (ROXICET) 5-325 MG tablet, Take 1 tablet by mouth every 6 (six) hours as needed for severe pain., Disp: 8 tablet, Rfl: 0 .  sildenafil (REVATIO) 20 MG tablet, TAKE 2-5 TABLETS BY MOUTH AS NEEDED FOR SEXUAL ACTIVITY, Disp: 50 tablet, Rfl: 5  Allergies  Allergen Reactions  . Cat Hair Extract Other (See Comments)    Watery eyes, Runny  Nose, and Itching   . Clarithromycin Itching    REACTION: rash   Review of Systems Objective:   Vitals:   09/02/17 0950  BP: 113/68  Pulse: 68  Resp: 16    General: Well developed, nourished, in no acute distress, alert and oriented x3   Dermatological: Skin is warm, dry and supple bilateral. Nails x 10 are well maintained; remaining integument appears unremarkable at this time. There are no open sores, no preulcerative lesions, no rash or signs of infection present.  Discoloration to the medial distal legs possibly associated with venous stasis.  Could also be a drug reaction.  He also has sharp incurvated nail margin along the tibial border of the hallux left.  Mild purulence mild malodor cellulitis extending to the PIPJ.  Vascular: Dorsalis Pedis artery and Posterior Tibial artery pedal pulses are 2/4 bilateral with immedate capillary fill time. Pedal hair growth present. No varicosities and no lower extremity edema present bilateral.   Neruologic: Grossly intact via light touch bilateral. Vibratory intact via tuning fork bilateral. Protective threshold with Semmes Wienstein monofilament intact to all pedal sites bilateral. Patellar and Achilles deep tendon reflexes 2+ bilateral. No Babinski or clonus noted bilateral.   Musculoskeletal: No gross boney pedal deformities bilateral. No pain, crepitus, or limitation noted with foot and ankle range of motion bilateral. Muscular strength 5/5 in all groups tested bilateral.  Gait:  Unassisted, Nonantalgic.    Radiographs:  None taken   Assessment & Plan:   Assessment: Ingrown nail tibial border hallux left  Plan: Performed a incision and drainage of the tibial border of the hallux left today.  He tolerated procedure well after local anesthetic consisting of 3 cc of a 50-50 Mr. Marcaine plain lidocaine plain was infiltrated in a hallux block left.  He was provided with doxycycline 1 tablet twice daily.  He is also provided with a  prescription for Cortisporin Otic to be applied twice daily after soaking.  I will follow-up with him in 1 to 2 weeks.     Max T. [place], [place]

## 2017-09-02 NOTE — Telephone Encounter (Signed)
Pt states he was seen in office today at 10:00am, and the antibiotic was not called to the pharmacy.

## 2017-09-02 NOTE — Telephone Encounter (Signed)
I informed pt I had reviewed pt's clinicals today and had sent the oral antibiotic and drops to the [facility].

## 2017-09-02 NOTE — Patient Instructions (Addendum)

## 2017-09-10 ENCOUNTER — Ambulatory Visit: Payer: Self-pay | Admitting: Podiatry

## 2017-09-15 ENCOUNTER — Ambulatory Visit (INDEPENDENT_AMBULATORY_CARE_PROVIDER_SITE_OTHER): Payer: Managed Care, Other (non HMO) | Admitting: Podiatry

## 2017-09-15 ENCOUNTER — Encounter: Payer: Self-pay | Admitting: Podiatry

## 2017-09-15 DIAGNOSIS — L600 Ingrowing nail: Secondary | ICD-10-CM

## 2017-09-15 NOTE — Progress Notes (Signed)
He presents today for follow-up of his matrixectomy tibial border hallux left.  He states that is doing great he had no problems with whatsoever feels much better than it did prior to procedure.  Objective: Vital signs are stable he is alert and oriented x3.  Ulcers are palpable there is no erythema edema cellulitis drainage or odor.  The excision site appears to be healing uneventfully no signs of infection.  Assessment: Well-healing surgical foot.  Plan: Follow-up with me on an as-needed basis for permanent matrixectomy.

## 2017-09-17 ENCOUNTER — Encounter: Payer: Self-pay | Admitting: Family Medicine

## 2017-09-17 ENCOUNTER — Ambulatory Visit (INDEPENDENT_AMBULATORY_CARE_PROVIDER_SITE_OTHER): Payer: Managed Care, Other (non HMO) | Admitting: Family Medicine

## 2017-09-17 VITALS — BP 116/82 | HR 66 | Temp 98.6°F | Resp 16 | Wt 275.2 lb

## 2017-09-17 DIAGNOSIS — S2243XD Multiple fractures of ribs, bilateral, subsequent encounter for fracture with routine healing: Secondary | ICD-10-CM | POA: Diagnosis not present
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
## 2017-09-17 NOTE — Patient Instructions (Signed)
Let me know if your rib pain recurs.

## 2017-12-01 ENCOUNTER — Encounter: Payer: Self-pay | Admitting: Emergency Medicine

## 2017-12-01 ENCOUNTER — Other Ambulatory Visit: Payer: Self-pay

## 2017-12-01 ENCOUNTER — Emergency Department
Admission: EM | Admit: 2017-12-01 | Discharge: 2017-12-02 | Disposition: A | Discharge status: Home / Self Care | Payer: Managed Care, Other (non HMO) | Attending: Emergency Medicine | Admitting: Emergency Medicine

## 2017-12-01 DIAGNOSIS — R1033 Periumbilical pain: Secondary | ICD-10-CM

## 2017-12-01 DIAGNOSIS — K5900 Constipation, unspecified: Secondary | ICD-10-CM | POA: Diagnosis not present

## 2017-12-01 DIAGNOSIS — Z87891 Personal history of nicotine dependence: Secondary | ICD-10-CM | POA: Insufficient documentation

## 2017-12-01 DIAGNOSIS — Z79899 Other long term (current) drug therapy: Secondary | ICD-10-CM | POA: Diagnosis not present

## 2017-12-01 LAB — COMPREHENSIVE METABOLIC PANEL
ALBUMIN: 4.5 g/dL (ref 3.5–5.0)
ALK PHOS: 66 U/L (ref 38–126)
ALT: 25 U/L (ref 0–44)
ANION GAP: 7 (ref 5–15)
AST: 29 U/L (ref 15–41)
BILIRUBIN TOTAL: 1.3 mg/dL — AB (ref 0.3–1.2)
BUN: 21 mg/dL — ABNORMAL HIGH (ref 6–20)
CALCIUM: 8.9 mg/dL (ref 8.9–10.3)
CO2: 29 mmol/L (ref 22–32)
Chloride: 106 mmol/L (ref 98–111)
Creatinine, Ser: 1.19 mg/dL (ref 0.61–1.24)
GFR calc Af Amer: >60 mL/min (ref >60)
GLUCOSE: 107 mg/dL — AB (ref 70–99)
Potassium: 3.7 mmol/L (ref 3.5–5.1)
Sodium: 142 mmol/L (ref 135–145)
TOTAL PROTEIN: 7.9 g/dL (ref 6.5–8.1)

## 2017-12-01 LAB — CBC WITH DIFFERENTIAL/PLATELET
BASOS PCT: 0 %
Basophils Absolute: 0 10*3/uL (ref 0–0.1)
Eosinophils Absolute: 0.1 10*3/uL (ref 0–0.7)
Eosinophils Relative: 2 %
HEMATOCRIT: 44.8 % (ref 40.0–52.0)
HEMOGLOBIN: 15.4 g/dL (ref 13.0–18.0)
LYMPHS ABS: 1.1 10*3/uL (ref 1.0–3.6)
LYMPHS PCT: 19 %
MCH: 29.5 pg (ref 26.0–34.0)
MCHC: 34.4 g/dL (ref 32.0–36.0)
MCV: 85.8 fL (ref 80.0–100.0)
MONOS PCT: 2 %
Monocytes Absolute: 0.1 10*3/uL — ABNORMAL LOW (ref 0.2–1.0)
NEUTROS ABS: 4.3 10*3/uL (ref 1.4–6.5)
NEUTROS PCT: 77 %
Platelets: 152 10*3/uL (ref 150–440)
RBC: 5.22 MIL/uL (ref 4.40–5.90)
RDW: 14.6 % — ABNORMAL HIGH (ref 11.5–14.5)
WBC: 5.6 10*3/uL (ref 3.8–10.6)

## 2017-12-01 LAB — LIPASE, BLOOD: Lipase: 27 U/L (ref 11–51)
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
## 2017-12-01 NOTE — ED Triage Notes (Signed)
Pt to triage via w/c with no distress noted; st this evening began having chills, frontal HA and lower abd pain

## 2017-12-02 ENCOUNTER — Encounter: Payer: Self-pay | Admitting: Radiology

## 2017-12-02 ENCOUNTER — Emergency Department: Payer: Managed Care, Other (non HMO)

## 2017-12-02 LAB — URINALYSIS, COMPLETE (UACMP) WITH MICROSCOPIC
BILIRUBIN URINE: NEGATIVE
Glucose, UA: NEGATIVE mg/dL
HGB URINE DIPSTICK: NEGATIVE
Ketones, ur: NEGATIVE mg/dL
Leukocytes, UA: NEGATIVE
NITRITE: NEGATIVE
PROTEIN: NEGATIVE mg/dL
Specific Gravity, Urine: 1.043 — ABNORMAL HIGH (ref 1.005–1.030)
Squamous Epithelial / LPF: NONE SEEN (ref 0–5)
pH: 6 (ref 5.0–8.0)

## 2017-12-02 MED ORDER — IOPAMIDOL (ISOVUE-300) INJECTION 61%
100.0000 mL | Freq: Once | INTRAVENOUS | Status: AC | PRN
  Administered 2017-12-02: 100 mL via INTRAVENOUS

## 2017-12-02 MED ORDER — ONDANSETRON HCL 4 MG/2ML IJ SOLN
4.0000 mg | Freq: Once | INTRAMUSCULAR | Status: AC
  Administered 2017-12-02: 4 mg via INTRAVENOUS
  Filled 2017-12-02: qty 2

## 2017-12-02 MED ORDER — DICYCLOMINE HCL 20 MG PO TABS: 20.0000 mg | ORAL_TABLET | Freq: Four times a day (QID) | ORAL | 0 refills | Status: DC | PRN

## 2017-12-02 MED ORDER — IOPAMIDOL (ISOVUE-300) INJECTION 61%
30.0000 mL | Freq: Once | INTRAVENOUS | Status: AC
  Administered 2017-12-02: 30 mL via ORAL

## 2017-12-02 MED ORDER — ONDANSETRON 4 MG PO TBDP: 4.0000 mg | ORAL_TABLET | Freq: Three times a day (TID) | ORAL | 0 refills | Status: DC | PRN

## 2017-12-02 MED ORDER — LACTULOSE 10 GM/15ML PO SOLN: 20.0000 g | Freq: Every day | ORAL | 0 refills | Status: DC | PRN

## 2017-12-02 MED ORDER — FENTANYL CITRATE (PF) 100 MCG/2ML IJ SOLN
50.0000 ug | Freq: Once | INTRAMUSCULAR | Status: AC
  Administered 2017-12-02: 50 ug via INTRAVENOUS
  Filled 2017-12-02: qty 2

## 2017-12-02 MED ORDER — SODIUM CHLORIDE 0.9 % IV BOLUS
500.0000 mL | Freq: Once | INTRAVENOUS | Status: AC
  Administered 2017-12-02: 500 mL via INTRAVENOUS

## 2017-12-02 NOTE — Discharge Instructions (Addendum)
1.  You may take lactulose as needed for bowel movements. 2.  You may take Bentyl and Zofran as needed for abdominal discomfort/nausea. 3.  Increase daily fluids. 4.  You will be notified of any positive blood culture results. 5.  Return to the ER for worsening symptoms, persistent vomiting, difficulty breathing or other concerns.

## 2017-12-02 NOTE — ED Notes (Signed)
Patient transported to CT 

## 2017-12-03 LAB — URINE CULTURE: Culture: NO GROWTH

## 2017-12-07 LAB — CULTURE, BLOOD (ROUTINE X 2)
CULTURE: NO GROWTH
Culture: NO GROWTH
SPECIAL REQUESTS: ADEQUATE

## 2018-02-22 NOTE — Discharge Instructions (Signed)
°  Instructions after Total Knee Replacement ° ° Alys Dulak P. Abdoulie Tierce, Jr., M.D.    ° Dept. of Orthopaedics & Sports Medicine ° [facility] ° [address] ° [place], [place]  [address] ° Phone: [phone]   Fax: [phone] ° °  °DIET: °• Drink plenty of non-alcoholic fluids. °• Resume your normal diet. Include foods high in fiber. ° °ACTIVITY:  °• You may use crutches or a walker with weight-bearing as tolerated, unless instructed otherwise. °• You may be weaned off of the walker or crutches by your Physical Therapist.  °• Do NOT place pillows under the knee. Anything placed under the knee could limit your ability to straighten the knee.   °• Continue doing gentle exercises. Exercising will reduce the pain and swelling, increase motion, and prevent muscle weakness.   °• Please continue to use the TED compression stockings for 6 weeks. You may remove the stockings at night, but should reapply them in the morning. °• Do not drive or operate any equipment until instructed. ° °WOUND CARE:  °• Continue to use the PolarCare or ice packs periodically to reduce pain and swelling. °• You may bathe or shower after the staples are removed at the first office visit following surgery. ° °MEDICATIONS: °• You may resume your regular medications. °• Please take the pain medication as prescribed on the medication. °• Do not take pain medication on an empty stomach. °• You have been given a prescription for a blood thinner (Lovenox or Coumadin). Please take the medication as instructed. (NOTE: After completing a 2 week course of Lovenox, take one Enteric-coated aspirin once a day. This along with elevation will help reduce the possibility of phlebitis in your operated leg.) °• Do not drive or drink alcoholic beverages when taking pain medications. ° °CALL THE OFFICE FOR: °• Temperature above 101 degrees °• Excessive bleeding or drainage on the dressing. °• Excessive swelling, coldness, or paleness of the toes. °• Persistent  nausea and vomiting. ° °FOLLOW-UP:  °• You should have an appointment to return to the office in 10-14 days after surgery. °• Arrangements have been made for continuation of Physical Therapy (either home therapy or outpatient therapy). °  °

## 2018-03-09 ENCOUNTER — Other Ambulatory Visit: Payer: Self-pay

## 2018-03-09 ENCOUNTER — Encounter
Admission: RE | Admit: 2018-03-09 | Discharge: 2018-03-09 | Disposition: A | Discharge status: Home / Self Care | Payer: 59 | Source: Ambulatory Visit | Attending: Orthopedic Surgery | Admitting: Orthopedic Surgery

## 2018-03-09 DIAGNOSIS — Z01818 Encounter for other preprocedural examination: Secondary | ICD-10-CM | POA: Insufficient documentation

## 2018-03-09 DIAGNOSIS — M1712 Unilateral primary osteoarthritis, left knee: Secondary | ICD-10-CM | POA: Diagnosis not present

## 2018-03-09 HISTORY — DX: Sleep apnea, unspecified: G47.30

## 2018-03-09 LAB — URINALYSIS, ROUTINE W REFLEX MICROSCOPIC
Bilirubin Urine: NEGATIVE
GLUCOSE, UA: NEGATIVE mg/dL
Hgb urine dipstick: NEGATIVE
KETONES UR: NEGATIVE mg/dL
LEUKOCYTES UA: NEGATIVE
NITRITE: NEGATIVE
PROTEIN: NEGATIVE mg/dL
Specific Gravity, Urine: 1.019 (ref 1.005–1.030)
pH: 7 (ref 5.0–8.0)

## 2018-03-09 LAB — PROTIME-INR
INR: 1.04
Prothrombin Time: 13.5 seconds (ref 11.4–15.2)

## 2018-03-09 LAB — COMPREHENSIVE METABOLIC PANEL
ALT: 30 U/L (ref 0–44)
AST: 27 U/L (ref 15–41)
Albumin: 4.3 g/dL (ref 3.5–5.0)
Alkaline Phosphatase: 54 U/L (ref 38–126)
Anion gap: 7 (ref 5–15)
BUN: 19 mg/dL (ref 6–20)
CHLORIDE: 107 mmol/L (ref 98–111)
CO2: 27 mmol/L (ref 22–32)
Calcium: 9.2 mg/dL (ref 8.9–10.3)
Creatinine, Ser: 0.78 mg/dL (ref 0.61–1.24)
GFR calc Af Amer: >60 mL/min (ref >60)
GFR calc non Af Amer: >60 mL/min (ref >60)
GLUCOSE: 116 mg/dL — AB (ref 70–99)
Potassium: 4.5 mmol/L (ref 3.5–5.1)
SODIUM: 141 mmol/L (ref 135–145)
Total Bilirubin: 1.4 mg/dL — ABNORMAL HIGH (ref 0.3–1.2)
Total Protein: 7.7 g/dL (ref 6.5–8.1)

## 2018-03-09 LAB — APTT: APTT: 27 s (ref 24–36)

## 2018-03-09 LAB — C-REACTIVE PROTEIN: CRP: <0.8 mg/dL (ref <1.0)

## 2018-03-09 LAB — SURGICAL PCR SCREEN
MRSA, PCR: NEGATIVE
STAPHYLOCOCCUS AUREUS: NEGATIVE

## 2018-03-09 LAB — CBC
HCT: 48.2 % (ref 39.0–52.0)
HEMOGLOBIN: 15.8 g/dL (ref 13.0–17.0)
MCH: 28.7 pg (ref 26.0–34.0)
MCHC: 32.8 g/dL (ref 30.0–36.0)
MCV: 87.6 fL (ref 80.0–100.0)
NRBC: 0 % (ref 0.0–0.2)
PLATELETS: 190 10*3/uL (ref 150–400)
RBC: 5.5 MIL/uL (ref 4.22–5.81)
RDW: 13.1 % (ref 11.5–15.5)
WBC: 6.2 10*3/uL (ref 4.0–10.5)

## 2018-03-09 LAB — TYPE AND SCREEN
ABO/RH(D): A POS
Antibody Screen: NEGATIVE

## 2018-03-09 LAB — SEDIMENTATION RATE: Sed Rate: 5 mm/hr (ref 0–15)

## 2018-03-09 NOTE — Patient Instructions (Signed)
  Your procedure is scheduled on: Wednesday March 23, 2018 Report to Same Day Surgery 2nd floor [facility] Entrance-take elevator on left to 2nd floor.  Check in with surgery information desk.) To find out your arrival time, call [phone] 1:00-3:00 PM on Tuesday March 22, 2018  Remember: Instructions that are not followed completely may result in serious medical risk, up to and including death, or upon the discretion of your surgeon and anesthesiologist your surgery may need to be rescheduled.    __x__ 1. Do not eat food (including mints, candies, chewing gum) after midnight the night before your procedure. You may drink clear liquids up to 2 hours before you are scheduled to arrive at the hospital for your procedure.  Do not drink anything within 2 hours of your scheduled arrival to the hospital.  Approved clear liquids:  --Water or Apple juice without pulp  --Clear carbohydrate beverage such as Gatorade or Powerade  --Black Coffee or Clear Tea (No milk, no creamers, do not add anything to the coffee or tea)    __x__ 2. No Alcohol for 24 hours before or after surgery.   __x__ 3. No Smoking or e-cigarettes for 24 hours before surgery.  Do not use any chewable tobacco products for at least 6 hours before surgery.   __x__ 4. Notify your doctor if there is any change in your medical condition (cold, fever, infections).   __x__ 5. On the morning of surgery brush your teeth with toothpaste and water.  You may rinse your mouth with mouthwash if you wish.  Do not swallow any toothpaste or mouthwash.  Please read over the following fact sheets that you were given:   [facility] Preparing for Surgery and/or MRSA Information    __x__ Use CHG Soap or Sage wipes as directed on instruction sheet.   Do not wear jewelry, lotions, powders, deodorant or perfumes on the day of surgery.  Do not shave below the face/neck 48 hours prior to surgery.   Do not bring valuables to the  hospital.    [facility] is not responsible for any belongings or valuables.               Contacts, dentures or bridgework may not be worn into surgery.  Leave your suitcase in the car. After surgery it may be brought to your room.  For patients admitted to the hospital, discharge time is determined by your treatment team.  __x__ Take these medicines on the morning of surgery with a SMALL SIP OF WATER:  1. Omeprazole/Prilosec  2. Cetirizine/Zyrtec if needed  __x__ Follow recommendations from Cardiologist, Pulmonologist or PCP regarding stopping Aspirin, Coumadin, Plavix, Eliquis, Effient, Pradaxa, and Pletal.  __x__ At least 7 days before surgery: Stop Anti-inflammatories such as Advil, Ibuprofen, Motrin, Aleve, Naproxen, Naprosyn, BC/Goodies powders or aspirin products. You may continue to take Tylenol and Celebrex.   __x__ TODAY: Stop supplements (Ascorbic Acid/Vitamin C) until after surgery. You may continue to take Vitamin D, Vitamin B, and multivitamin.

## 2018-03-10 LAB — URINE CULTURE
CULTURE: NO GROWTH
Special Requests: NORMAL

## 2018-03-20 ENCOUNTER — Other Ambulatory Visit: Payer: Self-pay

## 2018-03-20 ENCOUNTER — Emergency Department
Admission: EM | Admit: 2018-03-20 | Discharge: 2018-03-20 | Disposition: A | Discharge status: Home / Self Care | Payer: 59 | Source: Home / Self Care | Attending: Emergency Medicine | Admitting: Emergency Medicine

## 2018-03-20 ENCOUNTER — Emergency Department: Payer: 59

## 2018-03-20 DIAGNOSIS — M1712 Unilateral primary osteoarthritis, left knee: Secondary | ICD-10-CM | POA: Diagnosis not present

## 2018-03-20 DIAGNOSIS — Z79899 Other long term (current) drug therapy: Secondary | ICD-10-CM

## 2018-03-20 DIAGNOSIS — R1013 Epigastric pain: Secondary | ICD-10-CM

## 2018-03-20 LAB — HEPATIC FUNCTION PANEL
ALBUMIN: 4.1 g/dL (ref 3.5–5.0)
ALT: 24 U/L (ref 0–44)
AST: 22 U/L (ref 15–41)
Alkaline Phosphatase: 58 U/L (ref 38–126)
Bilirubin, Direct: 0.1 mg/dL (ref 0.0–0.2)
Indirect Bilirubin: 1.5 mg/dL — ABNORMAL HIGH (ref 0.3–0.9)
Total Bilirubin: 1.6 mg/dL — ABNORMAL HIGH (ref 0.3–1.2)
Total Protein: 7.6 g/dL (ref 6.5–8.1)

## 2018-03-20 LAB — URINALYSIS, COMPLETE (UACMP) WITH MICROSCOPIC
Bacteria, UA: NONE SEEN
Bilirubin Urine: NEGATIVE
Glucose, UA: NEGATIVE mg/dL
HGB URINE DIPSTICK: NEGATIVE
KETONES UR: NEGATIVE mg/dL
Leukocytes, UA: NEGATIVE
Nitrite: NEGATIVE
PROTEIN: NEGATIVE mg/dL
Specific Gravity, Urine: 1.019 (ref 1.005–1.030)
Squamous Epithelial / LPF: NONE SEEN (ref 0–5)
pH: 6 (ref 5.0–8.0)

## 2018-03-20 LAB — CBC
HEMATOCRIT: 46.2 % (ref 39.0–52.0)
HEMOGLOBIN: 15.5 g/dL (ref 13.0–17.0)
MCH: 29.2 pg (ref 26.0–34.0)
MCHC: 33.5 g/dL (ref 30.0–36.0)
MCV: 87.2 fL (ref 80.0–100.0)
NRBC: 0 % (ref 0.0–0.2)
Platelets: 178 10*3/uL (ref 150–400)
RBC: 5.3 MIL/uL (ref 4.22–5.81)
RDW: 12.9 % (ref 11.5–15.5)
WBC: 8.6 10*3/uL (ref 4.0–10.5)

## 2018-03-20 LAB — BASIC METABOLIC PANEL
Anion gap: 7 (ref 5–15)
BUN: 19 mg/dL (ref 6–20)
CHLORIDE: 109 mmol/L (ref 98–111)
CO2: 24 mmol/L (ref 22–32)
CREATININE: 0.81 mg/dL (ref 0.61–1.24)
Calcium: 8.7 mg/dL — ABNORMAL LOW (ref 8.9–10.3)
GFR calc non Af Amer: >60 mL/min (ref >60)
Glucose, Bld: 107 mg/dL — ABNORMAL HIGH (ref 70–99)
POTASSIUM: 4.1 mmol/L (ref 3.5–5.1)
SODIUM: 140 mmol/L (ref 135–145)

## 2018-03-20 LAB — LIPASE, BLOOD: LIPASE: 38 U/L (ref 11–51)

## 2018-03-20 LAB — TROPONIN I: Troponin I: <0.03 ng/mL (ref <0.03)

## 2018-03-20 MED ORDER — FAMOTIDINE 20 MG PO TABS
20.0000 mg | ORAL_TABLET | Freq: Once | ORAL | Status: AC
  Administered 2018-03-20: 20 mg via ORAL
  Filled 2018-03-20: qty 1

## 2018-03-20 MED ORDER — SUCRALFATE 1 G PO TABS: 1.0000 g | ORAL_TABLET | Freq: Two times a day (BID) | ORAL | 0 refills | Status: DC

## 2018-03-20 NOTE — ED Provider Notes (Signed)
[facility] Emergency Department Provider Note ____________________________________________   First MD Initiated Contact with Patient 03/20/18 1244     (approximate)  I have reviewed the triage vital signs and the nursing notes.   HISTORY  Chief Complaint Chest Pain    HPI Nicholas Hobbs is a 47 y.o. male with PMH as noted below including prior history of several episodes of pancreatitis who presents with epigastric abdominal pain (not chest pain) over the last 2 days, intermittent course, and worse after eating.  It radiates to his back.  He denies associated nausea or vomiting.  He has no chest pain or shortness of breath.  He has no change in his bowel movements.  He reports that the pain feels similar to when he had pancreatitis.  He denies any alcohol use recently, but states he did eat spicy food last night.  He also reports a history of gastritis.  Past Medical History:  Diagnosis Date  . GERD (gastroesophageal reflux disease)   . Pancreatitis   . Rib fracture   . Shoulder fracture, right   . Sleep apnea     Patient Active Problem List   Diagnosis Date Noted  . Fracture of scapula 07/21/2017  . Erectile dysfunction 07/15/2015  . Current tear knee, medial meniscus 05/08/2014  . Arthritis of knee, degenerative 05/08/2014  . Pancreatitis, recurrent 02/01/2012  . PANCREATITIS 04/30/2008  . SLEEP APNEA 04/30/2008    Past Surgical History:  Procedure Laterality Date  . APPENDECTOMY  2007  . ESOPHAGOGASTRODUODENOSCOPY (EGD) WITH PROPOFOL N/A 08/28/2016   Procedure: ESOPHAGOGASTRODUODENOSCOPY (EGD) WITH PROPOFOL WITH DILATION;  Surgeon: Jonathon Bellows, MD;  Location: [facility] ENDOSCOPY;  Service: Endoscopy;  Laterality: N/A;  . KNEE SURGERY Right 06/2014   meniscus repair    Prior to Admission medications   Medication Sig Start Date End Date Taking? Authorizing Provider  Ascorbic Acid (VITAMIN C) 1000 MG tablet Take 1,000 mg by mouth daily.    Yes  [provider]  cetirizine (ZYRTEC) 10 MG tablet Take 10 mg by mouth daily as needed for allergies.   Yes [provider]  Multiple Vitamins-Minerals (MULTIVITAMIN WITH MINERALS) tablet Take 1 tablet by mouth daily.   Yes [provider]  naproxen sodium (ALEVE) 220 MG tablet Take 440 mg by mouth daily.   Yes [provider]  Omeprazole 20 MG TBEC Take 20 mg by mouth daily.    Yes [provider]  sildenafil (REVATIO) 20 MG tablet TAKE 2-5 TABLETS BY MOUTH AS NEEDED FOR SEXUAL ACTIVITY 02/16/17   Carmon Ginsberg, PA  sucralfate (CARAFATE) 1 g tablet Take 1 tablet (1 g total) by mouth 2 (two) times daily for 15 days. 03/20/18 04/04/18  Arta Silence, MD    Allergies Cat hair extract and Clarithromycin  Family History  Problem Relation Age of Onset  . Seizures Father     Social History Social History   Tobacco Use  . Smoking status: Never Smoker  . Smokeless tobacco: Former Systems developer    Types: Chew  Substance Use Topics  . Alcohol use: Yes    Alcohol/week: 0.0 standard drinks  . Drug use: Never    Review of Systems  Constitutional: No fever. Eyes: No redness. ENT: No sore throat. Cardiovascular: Denies chest pain. Respiratory: Denies shortness of breath. Gastrointestinal: No vomiting or diarrhea. Genitourinary: Negative for flank pain.  Musculoskeletal: Negative for back pain. Skin: Negative for rash. Neurological: Negative for headache.  ____________________________________________   PHYSICAL EXAM:  VITAL SIGNS:  ED Triage Vitals  Enc Vitals Group     BP 03/20/18 1238 112/78     Pulse Rate 03/20/18 1240 65     Resp --      Temp --      Temp src --      SpO2 03/20/18 1240 97 %     Weight --      Height --      Head Circumference --      Peak Flow --      Pain Score 03/20/18 1057 7     Pain Loc --      Pain Edu? --      Excl. in [place]? --     Constitutional: Alert and oriented.  Relatively well appearing and in  no acute distress. Eyes: Conjunctivae are normal.  No scleral icterus. Head: Atraumatic. Nose: No congestion/rhinnorhea. Mouth/Throat: Mucous membranes are moist.   Neck: Normal range of motion.  Cardiovascular: Good peripheral circulation. Respiratory: Normal respiratory effort.  No retractions.  Gastrointestinal: Soft with very mild epigastric tenderness. No distention.  Genitourinary: No flank tenderness. Musculoskeletal: Extremities warm and well perfused.  Neurologic:  Normal speech and language. No gross focal neurologic deficits are appreciated.  Skin:  Skin is warm and dry. No rash noted. Psychiatric: Mood and affect are normal. Speech and behavior are normal.  ____________________________________________   LABS (all labs ordered are listed, but only abnormal results are displayed)  Labs Reviewed  BASIC METABOLIC PANEL - Abnormal; Notable for the following components:      Result Value   Glucose, Bld 107 (*)    Calcium 8.7 (*)    All other components within normal limits  URINALYSIS, COMPLETE (UACMP) WITH MICROSCOPIC - Abnormal; Notable for the following components:   Color, Urine YELLOW (*)    APPearance CLEAR (*)    All other components within normal limits  HEPATIC FUNCTION PANEL - Abnormal; Notable for the following components:   Total Bilirubin 1.6 (*)    Indirect Bilirubin 1.5 (*)    All other components within normal limits  CBC  TROPONIN I  LIPASE, BLOOD   ____________________________________________  EKG  ED ECG REPORT I, Arta Silence, the attending physician, personally viewed and interpreted this ECG.  Date: 03/20/2018 EKG Time: 1053 Rate: 67 Rhythm: normal sinus rhythm QRS Axis: normal Intervals: normal ST/T Wave abnormalities: normal Narrative Interpretation: no evidence of acute ischemia  ____________________________________________  RADIOLOGY  CXR: No focal  infiltrate  ____________________________________________   PROCEDURES  Procedure(s) performed: No  Procedures  Critical Care performed: No ____________________________________________   INITIAL IMPRESSION / ASSESSMENT AND PLAN / ED COURSE  Pertinent labs & imaging results that were available during my care of the patient were reviewed by me and considered in my medical decision making (see chart for details).  47 year old male with PMH as noted above presents with what he refers to as chest pain, although he points to his epigastric region and denies pain in the area that I would define as his chest.  He states it feels similar to prior pancreatitis.  On exam the patient is well-appearing and relatively comfortable.  His vital signs are normal.  His abdomen is soft with very mild epigastric tenderness.  The remainder of the exam is unremarkable.  Initial lab work-up from triage is normal.  LFTs were not obtained, so I added these on.  Other than minimally elevated bilirubin (which the patient has had in the past) there are no other abnormalities.  Overall given  the reassuring exam and lab work-up, my suspicion for pancreatitis is very low although it is possible the patient is having a very mild episode.  Presentation is more consistent with gastritis.  There is no evidence of cardiac etiology or indication for further cardiac work-up.  I gave the patient a dose of Pepcid and he had some improvement.  I instructed the patient to continue his Prilosec (which he did not take today), advised him on diet restrictions, and will prescribe Carafate as well.  Return precautions given, and he expressed understanding.   ____________________________________________   FINAL CLINICAL IMPRESSION(S) / ED DIAGNOSES  Final diagnoses:  Epigastric pain      NEW MEDICATIONS STARTED DURING THIS VISIT:  New Prescriptions   SUCRALFATE (CARAFATE) 1 G TABLET    Take 1 tablet (1 g total) by mouth  2 (two) times daily for 15 days.     Note:  This document was prepared using Dragon voice recognition software and may include unintentional dictation errors.    Arta Silence, MD 03/20/18 1452

## 2018-03-20 NOTE — ED Triage Notes (Signed)
Epigastric/central CP that began Friday. States goes to back. States hx pancreatitis and feels similar. Denies SOB< N&V, diaphoresis. Pain with deep breath. Denies fever.   A&O, ambulatory. No distress noted.

## 2018-03-20 NOTE — Discharge Instructions (Addendum)
Your pain is likely due to gastritis, although it is possible that you are having very mild pancreatitis.  You should restrict your diet as discussed, avoiding alcohol, caffeine, spicy foods, or heavy foods with a lot of oil, fat, difficult to digest meats, or dairy.  Take the Carafate as prescribed, and continue your Prilosec.  Return to the ER for new, worsening, persistent severe pain, vomiting, fever, or any other new or worsening symptoms that concern you.

## 2018-03-22 MED ORDER — GABAPENTIN 300 MG PO CAPS
300.0000 mg | ORAL_CAPSULE | Freq: Once | ORAL | Status: DC
  Administered 2018-03-23: 300 mg via ORAL

## 2018-03-22 MED ORDER — CELECOXIB 200 MG PO CAPS
400.0000 mg | ORAL_CAPSULE | Freq: Once | ORAL | Status: AC
  Administered 2018-03-23: 400 mg via ORAL

## 2018-03-22 MED ORDER — CHLORHEXIDINE GLUCONATE 4 % EX LIQD: 60.0000 mL | Freq: Once | CUTANEOUS | Status: DC

## 2018-03-22 MED ORDER — LACTATED RINGERS IV SOLN
INTRAVENOUS | Status: DC
  Administered 2018-03-23: 14:00:00 via INTRAVENOUS

## 2018-03-22 MED ORDER — TRANEXAMIC ACID-NACL 1000-0.7 MG/100ML-% IV SOLN
1000.0000 mg | INTRAVENOUS | Status: DC
  Filled 2018-03-22: qty 100

## 2018-03-22 MED ORDER — CEFAZOLIN SODIUM-DEXTROSE 2-4 GM/100ML-% IV SOLN: 2.0000 g | INTRAVENOUS | Status: DC

## 2018-03-22 MED ORDER — DEXAMETHASONE SODIUM PHOSPHATE 10 MG/ML IJ SOLN
8.0000 mg | Freq: Once | INTRAMUSCULAR | Status: DC
  Administered 2018-03-23: 8 mg via INTRAVENOUS

## 2018-03-23 ENCOUNTER — Encounter: Payer: Self-pay | Admitting: Orthopedic Surgery

## 2018-03-23 ENCOUNTER — Inpatient Hospital Stay
Admission: RE | Admit: 2018-03-23 | Discharge: 2018-03-25 | DRG: 470 | Disposition: A | Discharge status: Home Health | Payer: 59 | Attending: Orthopedic Surgery | Admitting: Orthopedic Surgery

## 2018-03-23 ENCOUNTER — Inpatient Hospital Stay: Payer: 59 | Admitting: Anesthesiology

## 2018-03-23 ENCOUNTER — Inpatient Hospital Stay: Payer: 59

## 2018-03-23 ENCOUNTER — Encounter
Admission: RE | Disposition: A | Discharge status: Home Health | Payer: Self-pay | Source: Home / Self Care | Attending: Orthopedic Surgery

## 2018-03-23 ENCOUNTER — Other Ambulatory Visit: Payer: Self-pay

## 2018-03-23 DIAGNOSIS — Z91048 Other nonmedicinal substance allergy status: Secondary | ICD-10-CM | POA: Diagnosis not present

## 2018-03-23 DIAGNOSIS — Z79899 Other long term (current) drug therapy: Secondary | ICD-10-CM

## 2018-03-23 DIAGNOSIS — Z8249 Family history of ischemic heart disease and other diseases of the circulatory system: Secondary | ICD-10-CM | POA: Diagnosis not present

## 2018-03-23 DIAGNOSIS — M25762 Osteophyte, left knee: Secondary | ICD-10-CM | POA: Diagnosis present

## 2018-03-23 DIAGNOSIS — G4730 Sleep apnea, unspecified: Secondary | ICD-10-CM | POA: Diagnosis present

## 2018-03-23 DIAGNOSIS — Z973 Presence of spectacles and contact lenses: Secondary | ICD-10-CM

## 2018-03-23 DIAGNOSIS — Z791 Long term (current) use of non-steroidal anti-inflammatories (NSAID): Secondary | ICD-10-CM | POA: Diagnosis not present

## 2018-03-23 DIAGNOSIS — Z881 Allergy status to other antibiotic agents status: Secondary | ICD-10-CM | POA: Diagnosis not present

## 2018-03-23 DIAGNOSIS — Z6836 Body mass index (BMI) 36.0-36.9, adult: Secondary | ICD-10-CM

## 2018-03-23 DIAGNOSIS — Z96652 Presence of left artificial knee joint: Secondary | ICD-10-CM

## 2018-03-23 DIAGNOSIS — E669 Obesity, unspecified: Secondary | ICD-10-CM | POA: Diagnosis present

## 2018-03-23 DIAGNOSIS — K219 Gastro-esophageal reflux disease without esophagitis: Secondary | ICD-10-CM | POA: Diagnosis present

## 2018-03-23 DIAGNOSIS — M1712 Unilateral primary osteoarthritis, left knee: Principal | ICD-10-CM | POA: Diagnosis present

## 2018-03-23 DIAGNOSIS — Z96659 Presence of unspecified artificial knee joint: Secondary | ICD-10-CM

## 2018-03-23 HISTORY — PX: KNEE ARTHROPLASTY: SHX992

## 2018-03-23 LAB — ABO/RH: ABO/RH(D): A POS

## 2018-03-23 SURGERY — ARTHROPLASTY, KNEE, TOTAL, USING IMAGELESS COMPUTER-ASSISTED NAVIGATION
Anesthesia: Spinal | Site: Knee | Laterality: Left

## 2018-03-23 MED ORDER — PROPOFOL 500 MG/50ML IV EMUL
INTRAVENOUS | Status: AC
  Filled 2018-03-23: qty 50

## 2018-03-23 MED ORDER — FENTANYL CITRATE (PF) 100 MCG/2ML IJ SOLN: 25.0000 ug | INTRAMUSCULAR | Status: DC | PRN

## 2018-03-23 MED ORDER — HYDROMORPHONE HCL 1 MG/ML IJ SOLN
0.5000 mg | INTRAMUSCULAR | Status: DC | PRN
  Filled 2018-03-23: qty 1

## 2018-03-23 MED ORDER — BUPIVACAINE HCL (PF) 0.25 % IJ SOLN
INTRAMUSCULAR | Status: AC
  Filled 2018-03-23: qty 60

## 2018-03-23 MED ORDER — FLEET ENEMA 7-19 GM/118ML RE ENEM: 1.0000 | ENEMA | Freq: Once | RECTAL | Status: DC | PRN

## 2018-03-23 MED ORDER — BUPIVACAINE HCL (PF) 0.25 % IJ SOLN
INTRAMUSCULAR | Status: DC | PRN
  Administered 2018-03-23: 60 mL

## 2018-03-23 MED ORDER — SODIUM CHLORIDE FLUSH 0.9 % IV SOLN
INTRAVENOUS | Status: AC
  Filled 2018-03-23: qty 40

## 2018-03-23 MED ORDER — LORATADINE 10 MG PO TABS
10.0000 mg | ORAL_TABLET | Freq: Every day | ORAL | Status: DC
  Administered 2018-03-24: 10 mg via ORAL
  Filled 2018-03-23: qty 1

## 2018-03-23 MED ORDER — MIDAZOLAM HCL 5 MG/5ML IJ SOLN
INTRAMUSCULAR | Status: DC | PRN
  Administered 2018-03-23: 2 mg via INTRAVENOUS

## 2018-03-23 MED ORDER — GABAPENTIN 300 MG PO CAPS
ORAL_CAPSULE | ORAL | Status: AC
  Administered 2018-03-23: 300 mg via ORAL
  Filled 2018-03-23: qty 1

## 2018-03-23 MED ORDER — ACETAMINOPHEN 10 MG/ML IV SOLN
1000.0000 mg | Freq: Four times a day (QID) | INTRAVENOUS | Status: AC
  Administered 2018-03-24 (×3): 1000 mg via INTRAVENOUS
  Filled 2018-03-23 (×4): qty 100

## 2018-03-23 MED ORDER — BUPIVACAINE LIPOSOME 1.3 % IJ SUSP
INTRAMUSCULAR | Status: AC
  Filled 2018-03-23: qty 20

## 2018-03-23 MED ORDER — SODIUM CHLORIDE 0.9 % IV SOLN
INTRAVENOUS | Status: DC
  Administered 2018-03-23: 23:00:00 via INTRAVENOUS

## 2018-03-23 MED ORDER — METOCLOPRAMIDE HCL 5 MG/ML IJ SOLN: 5.0000 mg | Freq: Three times a day (TID) | INTRAMUSCULAR | Status: DC | PRN

## 2018-03-23 MED ORDER — NEOMYCIN-POLYMYXIN B GU 40-200000 IR SOLN
Status: DC | PRN
  Administered 2018-03-23: 12 mL

## 2018-03-23 MED ORDER — ONDANSETRON HCL 4 MG/2ML IJ SOLN: 4.0000 mg | Freq: Once | INTRAMUSCULAR | Status: DC | PRN

## 2018-03-23 MED ORDER — ALUM & MAG HYDROXIDE-SIMETH 200-200-20 MG/5ML PO SUSP: 30.0000 mL | ORAL | Status: DC | PRN

## 2018-03-23 MED ORDER — DEXAMETHASONE SODIUM PHOSPHATE 10 MG/ML IJ SOLN
INTRAMUSCULAR | Status: AC
  Administered 2018-03-23: 8 mg via INTRAVENOUS
  Filled 2018-03-23: qty 1

## 2018-03-23 MED ORDER — ADULT MULTIVITAMIN W/MINERALS CH
1.0000 | ORAL_TABLET | Freq: Every day | ORAL | Status: DC
  Administered 2018-03-24: 1 via ORAL
  Filled 2018-03-23: qty 1

## 2018-03-23 MED ORDER — OXYCODONE HCL 5 MG PO TABS: 5.0000 mg | ORAL_TABLET | ORAL | Status: DC | PRN

## 2018-03-23 MED ORDER — OXYCODONE HCL 5 MG PO TABS
10.0000 mg | ORAL_TABLET | ORAL | Status: DC | PRN
  Administered 2018-03-24 – 2018-03-25 (×4): 10 mg via ORAL
  Filled 2018-03-23 (×4): qty 2

## 2018-03-23 MED ORDER — ACETAMINOPHEN 10 MG/ML IV SOLN
INTRAVENOUS | Status: DC | PRN
  Administered 2018-03-23: 1000 mg via INTRAVENOUS

## 2018-03-23 MED ORDER — MAGNESIUM HYDROXIDE 400 MG/5ML PO SUSP
30.0000 mL | Freq: Every day | ORAL | Status: DC
  Administered 2018-03-24: 30 mL via ORAL
  Filled 2018-03-23: qty 30

## 2018-03-23 MED ORDER — DEXTROSE 5 % IV SOLN
3.0000 g | Freq: Four times a day (QID) | INTRAVENOUS | Status: AC
  Administered 2018-03-23 – 2018-03-24 (×3): 3 g via INTRAVENOUS
  Filled 2018-03-23 (×2): qty 3
  Filled 2018-03-23: qty 3000
  Filled 2018-03-23: qty 3

## 2018-03-23 MED ORDER — TETRACAINE HCL 1 % IJ SOLN
INTRAMUSCULAR | Status: AC
  Filled 2018-03-23: qty 2

## 2018-03-23 MED ORDER — ACETAMINOPHEN 10 MG/ML IV SOLN
INTRAVENOUS | Status: AC
  Filled 2018-03-23: qty 100

## 2018-03-23 MED ORDER — BUPIVACAINE HCL (PF) 0.5 % IJ SOLN
INTRAMUSCULAR | Status: DC | PRN
  Administered 2018-03-23: 3.5 mL

## 2018-03-23 MED ORDER — CEFAZOLIN SODIUM-DEXTROSE 2-4 GM/100ML-% IV SOLN
INTRAVENOUS | Status: AC
  Filled 2018-03-23: qty 100

## 2018-03-23 MED ORDER — TETRACAINE HCL 1 % IJ SOLN
INTRAMUSCULAR | Status: DC | PRN
  Administered 2018-03-23: 5 mg via INTRASPINAL

## 2018-03-23 MED ORDER — CEFAZOLIN SODIUM-DEXTROSE 2-3 GM-%(50ML) IV SOLR
INTRAVENOUS | Status: DC | PRN
  Administered 2018-03-23: 2 g via INTRAVENOUS

## 2018-03-23 MED ORDER — CELECOXIB 200 MG PO CAPS
ORAL_CAPSULE | ORAL | Status: AC
  Administered 2018-03-23: 400 mg via ORAL
  Filled 2018-03-23: qty 2

## 2018-03-23 MED ORDER — FERROUS SULFATE 325 (65 FE) MG PO TABS
325.0000 mg | ORAL_TABLET | Freq: Two times a day (BID) | ORAL | Status: DC
  Administered 2018-03-24 – 2018-03-25 (×3): 325 mg via ORAL
  Filled 2018-03-23 (×3): qty 1

## 2018-03-23 MED ORDER — GABAPENTIN 300 MG PO CAPS
300.0000 mg | ORAL_CAPSULE | Freq: Every day | ORAL | Status: DC
  Administered 2018-03-24: 300 mg via ORAL
  Filled 2018-03-23: qty 1

## 2018-03-23 MED ORDER — LIDOCAINE HCL (PF) 2 % IJ SOLN
INTRAMUSCULAR | Status: AC
  Filled 2018-03-23: qty 10

## 2018-03-23 MED ORDER — FENTANYL CITRATE (PF) 100 MCG/2ML IJ SOLN
INTRAMUSCULAR | Status: AC
  Filled 2018-03-23: qty 2

## 2018-03-23 MED ORDER — PHENOL 1.4 % MT LIQD
1.0000 | OROMUCOSAL | Status: DC | PRN
  Filled 2018-03-23: qty 177

## 2018-03-23 MED ORDER — MIDAZOLAM HCL 2 MG/2ML IJ SOLN
INTRAMUSCULAR | Status: AC
  Filled 2018-03-23: qty 2

## 2018-03-23 MED ORDER — METOCLOPRAMIDE HCL 10 MG PO TABS: 5.0000 mg | ORAL_TABLET | Freq: Three times a day (TID) | ORAL | Status: DC | PRN

## 2018-03-23 MED ORDER — PROPOFOL 10 MG/ML IV BOLUS
INTRAVENOUS | Status: AC
  Filled 2018-03-23: qty 20

## 2018-03-23 MED ORDER — TRANEXAMIC ACID-NACL 1000-0.7 MG/100ML-% IV SOLN
INTRAVENOUS | Status: DC | PRN
  Administered 2018-03-23: 1000 mg via INTRAVENOUS

## 2018-03-23 MED ORDER — PROPOFOL 500 MG/50ML IV EMUL
INTRAVENOUS | Status: DC | PRN
  Administered 2018-03-23: 100 ug/kg/min via INTRAVENOUS

## 2018-03-23 MED ORDER — ONDANSETRON HCL 4 MG PO TABS: 4.0000 mg | ORAL_TABLET | Freq: Four times a day (QID) | ORAL | Status: DC | PRN

## 2018-03-23 MED ORDER — SODIUM CHLORIDE (PF) 0.9 % IJ SOLN
INTRAMUSCULAR | Status: DC | PRN
  Administered 2018-03-23: 40 mL

## 2018-03-23 MED ORDER — ENOXAPARIN SODIUM 30 MG/0.3ML ~~LOC~~ SOLN
30.0000 mg | Freq: Two times a day (BID) | SUBCUTANEOUS | Status: DC
  Administered 2018-03-24 – 2018-03-25 (×3): 30 mg via SUBCUTANEOUS
  Filled 2018-03-23 (×3): qty 0.3

## 2018-03-23 MED ORDER — SENNOSIDES-DOCUSATE SODIUM 8.6-50 MG PO TABS
1.0000 | ORAL_TABLET | Freq: Two times a day (BID) | ORAL | Status: DC
  Administered 2018-03-23 – 2018-03-24 (×3): 1 via ORAL
  Filled 2018-03-23 (×3): qty 1

## 2018-03-23 MED ORDER — LIDOCAINE HCL (CARDIAC) PF 100 MG/5ML IV SOSY
PREFILLED_SYRINGE | INTRAVENOUS | Status: DC | PRN
  Administered 2018-03-23: 60 mg via INTRAVENOUS

## 2018-03-23 MED ORDER — MENTHOL 3 MG MT LOZG
1.0000 | LOZENGE | OROMUCOSAL | Status: DC | PRN
  Filled 2018-03-23: qty 9

## 2018-03-23 MED ORDER — CELECOXIB 200 MG PO CAPS
200.0000 mg | ORAL_CAPSULE | Freq: Two times a day (BID) | ORAL | Status: DC
  Administered 2018-03-24 (×2): 200 mg via ORAL
  Filled 2018-03-23 (×2): qty 1

## 2018-03-23 MED ORDER — PROPOFOL 500 MG/50ML IV EMUL
INTRAVENOUS | Status: AC
  Filled 2018-03-23: qty 100

## 2018-03-23 MED ORDER — PANTOPRAZOLE SODIUM 40 MG PO TBEC
40.0000 mg | DELAYED_RELEASE_TABLET | Freq: Two times a day (BID) | ORAL | Status: DC
  Administered 2018-03-23 – 2018-03-24 (×3): 40 mg via ORAL
  Filled 2018-03-23 (×3): qty 1

## 2018-03-23 MED ORDER — METOCLOPRAMIDE HCL 10 MG PO TABS
10.0000 mg | ORAL_TABLET | Freq: Three times a day (TID) | ORAL | Status: DC
  Administered 2018-03-24 – 2018-03-25 (×5): 10 mg via ORAL
  Filled 2018-03-23 (×5): qty 1

## 2018-03-23 MED ORDER — TRANEXAMIC ACID 1000 MG/10ML IV SOLN
1000.0000 mg | INTRAVENOUS | Status: AC
  Administered 2018-03-23: 1000 mg via INTRAVENOUS
  Filled 2018-03-23: qty 1000

## 2018-03-23 MED ORDER — TRANEXAMIC ACID-NACL 1000-0.7 MG/100ML-% IV SOLN
1000.0000 mg | Freq: Once | INTRAVENOUS | Status: DC
  Filled 2018-03-23: qty 100

## 2018-03-23 MED ORDER — NEOMYCIN-POLYMYXIN B GU 40-200000 IR SOLN
Status: AC
  Filled 2018-03-23: qty 12

## 2018-03-23 MED ORDER — SUCRALFATE 1 G PO TABS
1.0000 g | ORAL_TABLET | Freq: Two times a day (BID) | ORAL | Status: DC
  Administered 2018-03-23 – 2018-03-24 (×3): 1 g via ORAL
  Filled 2018-03-23 (×3): qty 1

## 2018-03-23 MED ORDER — VITAMIN C 500 MG PO TABS
1000.0000 mg | ORAL_TABLET | Freq: Every day | ORAL | Status: DC
  Administered 2018-03-24: 1000 mg via ORAL
  Filled 2018-03-23: qty 2

## 2018-03-23 MED ORDER — BISACODYL 10 MG RE SUPP
10.0000 mg | Freq: Every day | RECTAL | Status: DC | PRN
  Filled 2018-03-23: qty 1

## 2018-03-23 MED ORDER — LACTATED RINGERS IV SOLN
INTRAVENOUS | Status: DC | PRN
  Administered 2018-03-23 (×2): via INTRAVENOUS

## 2018-03-23 MED ORDER — PROPOFOL 10 MG/ML IV BOLUS
INTRAVENOUS | Status: DC | PRN
  Administered 2018-03-23: 50 mg via INTRAVENOUS

## 2018-03-23 MED ORDER — SODIUM CHLORIDE 0.9 % IV SOLN
INTRAVENOUS | Status: DC | PRN
  Administered 2018-03-23: 60 mL

## 2018-03-23 MED ORDER — ONDANSETRON HCL 4 MG/2ML IJ SOLN: 4.0000 mg | Freq: Four times a day (QID) | INTRAMUSCULAR | Status: DC | PRN

## 2018-03-23 MED ORDER — ACETAMINOPHEN 325 MG PO TABS: 325.0000 mg | ORAL_TABLET | Freq: Four times a day (QID) | ORAL | Status: DC | PRN

## 2018-03-23 MED ORDER — TRAMADOL HCL 50 MG PO TABS: 50.0000 mg | ORAL_TABLET | ORAL | Status: DC | PRN

## 2018-03-23 MED ORDER — DIPHENHYDRAMINE HCL 12.5 MG/5ML PO ELIX: 12.5000 mg | ORAL_SOLUTION | ORAL | Status: DC | PRN

## 2018-03-23 SURGICAL SUPPLY — 70 items
ATTUNE MED DOME PAT 41 KNEE (Knees) ×2 IMPLANT
ATTUNE MED DOME PAT 41MM KNEE (Knees) ×1 IMPLANT
ATTUNE PS FEM LT CEM SZ9 KNEE (Femur) ×3 IMPLANT
BASE TIBIAL ATTUNE KNEE SZ9 (Knees) ×1 IMPLANT
BATTERY INSTRU NAVIGATION (MISCELLANEOUS) ×12 IMPLANT
BLADE SAW 70X12.5 (BLADE) ×3 IMPLANT
BLADE SAW 90X13X1.19 OSCILLAT (BLADE) ×3 IMPLANT
BLADE SAW 90X25X1.19 OSCILLAT (BLADE) ×3 IMPLANT
CANISTER SUCT 1200ML W/VALVE (MISCELLANEOUS) ×3 IMPLANT
CANISTER SUCT 3000ML PPV (MISCELLANEOUS) ×6 IMPLANT
CEMENT HV SMART SET (Cement) ×6 IMPLANT
COOLER POLAR GLACIER W/PUMP (MISCELLANEOUS) ×3 IMPLANT
COVER WAND RF STERILE (DRAPES) ×3 IMPLANT
CUFF TOURN 24 STER (MISCELLANEOUS) IMPLANT
CUFF TOURN 30 STER DUAL PORT (MISCELLANEOUS) IMPLANT
DRAPE SHEET LG 3/4 BI-LAMINATE (DRAPES) ×3 IMPLANT
DRSG DERMACEA 8X12 NADH (GAUZE/BANDAGES/DRESSINGS) ×3 IMPLANT
DRSG OPSITE POSTOP 4X14 (GAUZE/BANDAGES/DRESSINGS) ×3 IMPLANT
DRSG TEGADERM 4X4.75 (GAUZE/BANDAGES/DRESSINGS) ×3 IMPLANT
DURAPREP 26ML APPLICATOR (WOUND CARE) ×6 IMPLANT
ELECT CAUTERY BLADE 6.4 (BLADE) ×3 IMPLANT
ELECT REM PT RETURN 9FT ADLT (ELECTROSURGICAL) ×3
ELECTRODE REM PT RTRN 9FT ADLT (ELECTROSURGICAL) ×1 IMPLANT
EX-PIN ORTHOLOCK NAV 4X150 (PIN) ×6 IMPLANT
GLOVE BIOGEL M STRL SZ7.5 (GLOVE) ×6 IMPLANT
GLOVE BIOGEL PI IND STRL 9 (GLOVE) ×1 IMPLANT
GLOVE BIOGEL PI INDICATOR 9 (GLOVE) ×2
GLOVE INDICATOR 8.0 STRL GRN (GLOVE) ×3 IMPLANT
GLOVE SURG SYN 9.0  PF PI (GLOVE) ×2
GLOVE SURG SYN 9.0 PF PI (GLOVE) ×1 IMPLANT
GOWN STRL REUS W/ TWL LRG LVL3 (GOWN DISPOSABLE) ×2 IMPLANT
GOWN STRL REUS W/TWL 2XL LVL3 (GOWN DISPOSABLE) ×3 IMPLANT
GOWN STRL REUS W/TWL LRG LVL3 (GOWN DISPOSABLE) ×4
HEMOVAC 400CC 10FR (MISCELLANEOUS) ×3 IMPLANT
HOLDER FOLEY CATH W/STRAP (MISCELLANEOUS) ×3 IMPLANT
HOOD PEEL AWAY FLYTE STAYCOOL (MISCELLANEOUS) ×6 IMPLANT
INSERT TIBIAL ATTUNE SZ9 5MM (Insert) ×3 IMPLANT
KIT TURNOVER KIT A (KITS) ×3 IMPLANT
KNIFE SCULPS 14X20 (INSTRUMENTS) ×3 IMPLANT
LABEL OR SOLS (LABEL) ×3 IMPLANT
NDL SAFETY ECLIPSE 18X1.5 (NEEDLE) ×1 IMPLANT
NEEDLE HYPO 18GX1.5 SHARP (NEEDLE) ×2
NEEDLE SPNL 20GX3.5 QUINCKE YW (NEEDLE) ×6 IMPLANT
NS IRRIG 500ML POUR BTL (IV SOLUTION) ×3 IMPLANT
PACK TOTAL KNEE (MISCELLANEOUS) ×3 IMPLANT
PAD WRAPON POLAR KNEE (MISCELLANEOUS) ×1 IMPLANT
PENCIL SMOKE ULTRAEVAC 22 CON (MISCELLANEOUS) ×3 IMPLANT
PIN DRILL QUICK PACK ×3 IMPLANT
PIN FIXATION 1/8DIA X 3INL (PIN) ×9 IMPLANT
PULSAVAC PLUS IRRIG FAN TIP (DISPOSABLE) ×3
SOL .9 NS 3000ML IRR  AL (IV SOLUTION) ×2
SOL .9 NS 3000ML IRR UROMATIC (IV SOLUTION) ×1 IMPLANT
SOL PREP PVP 2OZ (MISCELLANEOUS) ×3
SOLUTION PREP PVP 2OZ (MISCELLANEOUS) ×1 IMPLANT
SPONGE DRAIN TRACH 4X4 STRL 2S (GAUZE/BANDAGES/DRESSINGS) ×3 IMPLANT
STAPLER SKIN PROX 35W (STAPLE) ×3 IMPLANT
STRAP TIBIA SHORT (MISCELLANEOUS) ×3 IMPLANT
SUCTION FRAZIER HANDLE 10FR (MISCELLANEOUS) ×2
SUCTION TUBE FRAZIER 10FR DISP (MISCELLANEOUS) ×1 IMPLANT
SUT VIC AB 0 CT1 36 (SUTURE) ×3 IMPLANT
SUT VIC AB 1 CT1 36 (SUTURE) ×6 IMPLANT
SUT VIC AB 2-0 CT2 27 (SUTURE) ×3 IMPLANT
SYR 20CC LL (SYRINGE) ×3 IMPLANT
SYR 30ML LL (SYRINGE) ×6 IMPLANT
TIBIAL BASE ATTUNE KNEE SZ9 (Knees) ×3 IMPLANT
TIP FAN IRRIG PULSAVAC PLUS (DISPOSABLE) ×1 IMPLANT
TOWEL OR 17X26 4PK STRL BLUE (TOWEL DISPOSABLE) ×3 IMPLANT
TOWER CARTRIDGE SMART MIX (DISPOSABLE) ×3 IMPLANT
TRAY FOLEY MTR SLVR 16FR STAT (SET/KITS/TRAYS/PACK) ×3 IMPLANT
WRAPON POLAR PAD KNEE (MISCELLANEOUS) ×3

## 2018-03-23 NOTE — Anesthesia Procedure Notes (Signed)
Spinal  Patient location during procedure: OR Start time: 03/23/2018 4:50 PM End time: 03/23/2018 4:55 PM Staffing Anesthesiologist: Alvin Critchley, MD Resident/CRNA: Dionne Bucy, CRNA Performed: resident/CRNA  Preanesthetic Checklist Completed: patient identified, site marked, surgical consent, pre-op evaluation, timeout performed, IV checked, risks and benefits discussed and monitors and equipment checked Spinal Block Patient position: sitting Prep: ChloraPrep Patient monitoring: heart rate, continuous pulse ox, blood pressure and cardiac monitor Approach: midline Location: L3-4 Injection technique: single-shot Needle Needle type: Introducer and Pencan  Needle gauge: 24 G Needle length: 10 cm Assessment Sensory level: T6 Additional Notes Negative paresthesia. Negative blood return. Positive free-flowing CSF. Expiration date of kit checked and confirmed. Patient tolerated procedure well, without complications.

## 2018-03-23 NOTE — Anesthesia Preprocedure Evaluation (Signed)
Anesthesia Evaluation  Patient identified by MRN, date of birth, ID band Patient awake    Reviewed: Allergy & Precautions, NPO status , Patient's Chart, lab work & pertinent test results  History of Anesthesia Complications Negative for: history of anesthetic complications  Airway Mallampati: II       Dental   Pulmonary sleep apnea (using mouth pieces, not using CPAP ) , neg COPD,           Cardiovascular (-) hypertension(-) Past MI and (-) CHF (-) dysrhythmias (-) Valvular Problems/Murmurs     Neuro/Psych neg Seizures    GI/Hepatic Neg liver ROS, GERD  Medicated and Controlled,  Endo/Other  neg diabetes  Renal/GU negative Renal ROS     Musculoskeletal   Abdominal   Peds  Hematology   Anesthesia Other Findings   Reproductive/Obstetrics                             Anesthesia Physical Anesthesia Plan  ASA: II  Anesthesia Plan: Spinal   Post-op Pain Management:    Induction:   PONV Risk Score and Plan: 1  Airway Management Planned:   Additional Equipment:   Intra-op Plan:   Post-operative Plan:   Informed Consent: I have reviewed the patients History and Physical, chart, labs and discussed the procedure including the risks, benefits and alternatives for the proposed anesthesia with the patient or authorized representative who has indicated his/her understanding and acceptance.     Plan Discussed with:   Anesthesia Plan Comments:         Anesthesia Quick Evaluation

## 2018-03-23 NOTE — Transfer of Care (Signed)
Immediate Anesthesia Transfer of Care Note  Patient: Nicholas Hobbs  Procedure(s) Performed: Procedure(s): COMPUTER ASSISTED TOTAL KNEE ARTHROPLASTY (Left)  Patient Location: PACU  Anesthesia Type:Spinal  Level of Consciousness: awake, alert  and oriented  Airway & Oxygen Therapy: Patient Spontanous Breathing and Patient connected to face mask oxygen  Post-op Assessment: Report given to RN and Post -op Vital signs reviewed and stable  Post vital signs: Reviewed and stable  Last Vitals:  Vitals:   03/23/18 1331 03/23/18 2038  BP: 119/88 119/85  Pulse: 62 91  Resp: 18 14  Temp: 37 C 36.5 C  SpO2: 18% 86%    Complications: No apparent anesthesia complications

## 2018-03-23 NOTE — H&P (Signed)
The patient has been re-examined, and the chart reviewed, and there have been no interval changes to the documented history and physical.    The risks, benefits, and alternatives have been discussed at length. The patient expressed understanding of the risks benefits and agreed with plans for surgical intervention.  James P. Hooten, Jr. M.D.    

## 2018-03-23 NOTE — Op Note (Signed)
OPERATIVE NOTE  DATE OF SURGERY:  03/23/2018  PATIENT NAME:  CRANFORD BLESSINGER   DOB: [birth year]  MRN: [id number]  PRE-OPERATIVE DIAGNOSIS: Degenerative arthrosis of the left knee, primary  POST-OPERATIVE DIAGNOSIS:  Same  PROCEDURE:  Left total knee arthroplasty using computer-assisted navigation  SURGEON:  Marciano Sequin. M.D.  ASSISTANT:  Vance Peper, PA (present and scrubbed throughout the case, critical for assistance with exposure, retraction, instrumentation, and closure)  ANESTHESIA: spinal  ESTIMATED BLOOD LOSS: 50 mL  FLUIDS REPLACED: 1700 mL of crystalloid  TOURNIQUET TIME: 104 minutes  DRAINS: 2 medium Hemovac drains  SOFT TISSUE RELEASES: Anterior cruciate ligament, posterior cruciate ligament, deep medial collateral ligament, patellofemoral ligament  IMPLANTS UTILIZED: DePuy Attune size 9 posterior stabilized femoral component (cemented), size 9 rotating platform tibial component (cemented), 41 mm medialized dome patella (cemented), and a 5 mm stabilized rotating platform polyethylene insert.  INDICATIONS FOR SURGERY: TIMOTY BOURKE is a 47 y.o. year old male with a long history of progressive knee pain. X-rays demonstrated severe degenerative changes in tricompartmental fashion. The patient had not seen any significant improvement despite conservative nonsurgical intervention. After discussion of the risks and benefits of surgical intervention, the patient expressed understanding of the risks benefits and agree with plans for total knee arthroplasty.   The risks, benefits, and alternatives were discussed at length including but not limited to the risks of infection, bleeding, nerve injury, stiffness, blood clots, the need for revision surgery, cardiopulmonary complications, among others, and they were willing to proceed.  PROCEDURE IN DETAIL: The patient was brought into the operating room and, after adequate spinal anesthesia was achieved, a tourniquet was placed on  the patient's upper thigh. The patient's knee and leg were cleaned and prepped with alcohol and DuraPrep and draped in the usual sterile fashion. A "timeout" was performed as per usual protocol. The lower extremity was exsanguinated using an Esmarch, and the tourniquet was inflated to 300 mmHg. An anterior longitudinal incision was made followed by a standard mid vastus approach. The deep fibers of the medial collateral ligament were elevated in a subperiosteal fashion off of the medial flare of the tibia so as to maintain a continuous soft tissue sleeve. The patella was subluxed laterally and the patellofemoral ligament was incised. Inspection of the knee demonstrated severe degenerative changes with full-thickness loss of articular cartilage. Osteophytes were debrided using a rongeur. Anterior and posterior cruciate ligaments were excised. Two 4.0 mm Schanz pins were inserted in the femur and into the tibia for attachment of the array of trackers used for computer-assisted navigation. Hip center was identified using a circumduction technique. Distal landmarks were mapped using the computer. The distal femur and proximal tibia were mapped using the computer. The distal femoral cutting guide was positioned using computer-assisted navigation so as to achieve a 5 distal valgus cut. The femur was sized and it was felt that a size 9 femoral component was appropriate. A size 9 femoral cutting guide was positioned and the anterior cut was performed and verified using the computer. This was followed by completion of the posterior and chamfer cuts. Femoral cutting guide for the central box was then positioned in the center box cut was performed.  Attention was then directed to the proximal tibia. Medial and lateral menisci were excised. The extramedullary tibial cutting guide was positioned using computer-assisted navigation so as to achieve a 0 varus-valgus alignment and 3 posterior slope. The cut was performed and  verified using the computer. The proximal tibia  was sized and it was felt that a size 9 tibial tray was appropriate. Tibial and femoral trials were inserted followed by insertion of a 5 mm polyethylene insert. This allowed for excellent mediolateral soft tissue balancing both in flexion and in full extension. Finally, the patella was cut and prepared so as to accommodate a 41 mm medialized dome patella. A patella trial was placed and the knee was placed through a range of motion with excellent patellar tracking appreciated. The femoral trial was removed after debridement of posterior osteophytes. The central post-hole for the tibial component was reamed followed by insertion of a keel punch. Tibial trials were then removed. Cut surfaces of bone were irrigated with copious amounts of normal saline with antibiotic solution using pulsatile lavage and then suctioned dry. Polymethylmethacrylate cement was prepared in the usual fashion using a vacuum mixer. Cement was applied to the cut surface of the proximal tibia as well as along the undersurface of a size 9 rotating platform tibial component. Tibial component was positioned and impacted into place. Excess cement was removed using [employer] fast streamer. Cement was then applied to the cut surfaces of the femur as well as along the posterior flanges of the size 9 femoral component. The femoral component was positioned and impacted into place. Excess cement was removed using [employer] fast streamer. A 5 mm polyethylene trial was inserted and the knee was brought into full extension with steady axial compression applied. Finally, cement was applied to the backside of a 41 mm medialized dome patella and the patellar component was positioned and patellar clamp applied. Excess cement was removed using [employer] fast streamer. After adequate curing of the cement, the tourniquet was deflated after a total tourniquet time of 104 minutes. Hemostasis was achieved using electrocautery. The knee was  irrigated with copious amounts of normal saline with antibiotic solution using pulsatile lavage and then suctioned dry. 20 mL of 1.3% Exparel and 60 mL of 0.25% Marcaine in 40 mL of normal saline was injected along the posterior capsule, medial and lateral gutters, and along the arthrotomy site. A 5 mm stabilized rotating platform polyethylene insert was inserted and the knee was placed through a range of motion with excellent mediolateral soft tissue balancing appreciated and excellent patellar tracking noted. 2 medium drains were placed in the wound bed and brought out through separate stab incisions. The medial parapatellar portion of the incision was reapproximated using interrupted sutures of #1 Vicryl. Subcutaneous tissue was approximated in layers using first #0 Vicryl followed #2-0 Vicryl. The skin was approximated with skin staples. A sterile dressing was applied.  The patient tolerated the procedure well and was transported to the recovery room in stable condition.    Lainey Nelson P. Holley Bouche., M.D.

## 2018-03-23 NOTE — Anesthesia Post-op Follow-up Note (Signed)
Anesthesia QCDR form completed.        

## 2018-03-23 NOTE — Plan of Care (Signed)
  Problem: Education: Goal: Verbalization of understanding the information provided (i.e., activity precautions, restrictions, etc) will improve Outcome: Progressing Goal: Individualized Educational Video(s) Outcome: Progressing   Problem: Activity: Goal: Ability to ambulate and perform ADLs will improve Outcome: Progressing   Problem: Clinical Measurements: Goal: Postoperative complications will be avoided or minimized Outcome: Progressing   

## 2018-03-24 ENCOUNTER — Encounter: Payer: Self-pay | Admitting: Orthopedic Surgery

## 2018-03-24 MED ORDER — ENOXAPARIN SODIUM 40 MG/0.4ML ~~LOC~~ SOLN: 40.0000 mg | SUBCUTANEOUS | 0 refills | Status: DC

## 2018-03-24 MED ORDER — OXYCODONE HCL 5 MG PO TABS: 5.0000 mg | ORAL_TABLET | Freq: Four times a day (QID) | ORAL | 0 refills | Status: DC | PRN

## 2018-03-24 MED ORDER — TRAMADOL HCL 50 MG PO TABS: 50.0000 mg | ORAL_TABLET | ORAL | 0 refills | Status: DC | PRN

## 2018-03-24 NOTE — Anesthesia Postprocedure Evaluation (Signed)
Anesthesia Post Note  Patient: Nicholas Hobbs  Procedure(s) Performed: COMPUTER ASSISTED TOTAL KNEE ARTHROPLASTY (Left Knee)  Patient location during evaluation: Nursing Unit Anesthesia Type: Spinal Level of consciousness: oriented and awake and alert Pain management: pain level controlled Vital Signs Assessment: post-procedure vital signs reviewed and stable Respiratory status: spontaneous breathing, respiratory function stable and patient connected to nasal cannula oxygen Cardiovascular status: blood pressure returned to baseline and stable Postop Assessment: no headache, no backache and no apparent nausea or vomiting Anesthetic complications: no     Last Vitals:  Vitals:   03/24/18 0420 03/24/18 0748  BP: 105/70 112/77  Pulse: 60 (!) 58  Resp: 18   Temp:  36.5 C  SpO2: 97% 94%    Last Pain:  Vitals:   03/24/18 0748  TempSrc: Oral  PainSc:                  Estill Batten

## 2018-03-24 NOTE — Progress Notes (Signed)
Patient request sleep aid if staying another night. Spoke to Dr. Marry Guan while he was rounding on unit and no new orders rec'd. Will access at bed time tonight.

## 2018-03-24 NOTE — NC FL2 (Signed)
[place] LEVEL OF CARE SCREENING TOOL     IDENTIFICATION  Patient Name: Nicholas Hobbs Birthdate: 25-Aug-1970 Sex: male Admission Date (Current Location): 03/23/2018  [place] and [place] Number:  Engineering geologist and Address:  [facility], [address] [place], [place] [address]      Provider Number: [id number]  Attending Physician Name and Address:  Dereck Leep, MD  Relative Name and Phone Number:       Current Level of Care: Hospital Recommended Level of Care: [place] Prior Approval Number:    Date Approved/Denied:   PASRR Number: [id number])  Discharge Plan: SNF    Current Diagnoses: Patient Active Problem List   Diagnosis Date Noted  . 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 Fracture of scapula 07/21/2017  . Erectile dysfunction 07/15/2015  . Current tear knee, medial meniscus 05/08/2014  . Arthritis of knee, degenerative 05/08/2014  . Pancreatitis, recurrent 02/01/2012  . PANCREATITIS 04/30/2008  . SLEEP APNEA 04/30/2008    Orientation RESPIRATION BLADDER Height & Weight     Self, Time, Situation, Place  Normal Continent Weight: (!) 300 lb 8 oz (136.3 kg) Height:     BEHAVIORAL SYMPTOMS/MOOD NEUROLOGICAL BOWEL NUTRITION STATUS      Continent Diet(Diet: Regular )  AMBULATORY STATUS COMMUNICATION OF NEEDS Skin   Extensive Assist Verbally Surgical wounds(Incision: Left Knee. )                       Personal Care Assistance Level of Assistance  Bathing, Feeding, Dressing Bathing Assistance: Limited assistance Feeding assistance: Independent Dressing Assistance: Limited assistance     Functional Limitations Info  Sight, Hearing, Speech Sight Info: Adequate Hearing Info: Adequate Speech Info: Adequate    SPECIAL CARE FACTORS FREQUENCY  PT (By licensed PT), OT (By licensed OT)     PT Frequency: (5) OT Frequency: (5)            Contractures      Additional  Factors Info  Code Status, Allergies Code Status Info: (Full Code. ) Allergies Info: (Cat Hair Extract, Clarithromycin)           Current Medications (03/24/2018):  This is the current hospital active medication list Current Facility-Administered Medications  Medication Dose Route Frequency Provider Last Rate Last Dose  . 0.9 %  sodium chloride infusion   Intravenous Continuous Dereck Leep, MD 100 mL/hr at 03/23/18 2320    . acetaminophen (OFIRMEV) IV 1,000 mg  1,000 mg Intravenous Q6H Hooten, Laurice Record, MD 400 mL/hr at 03/24/18 1448 1,000 mg at 03/24/18 1856  . acetaminophen (TYLENOL) tablet 325-650 mg  325-650 mg Oral Q6H PRN Hooten, Laurice Record, MD      . alum & mag hydroxide-simeth (MAALOX/MYLANTA) 200-200-20 MG/5ML suspension 30 mL  30 mL Oral Q4H PRN Hooten, Laurice Record, MD      . bisacodyl (DULCOLAX) suppository 10 mg  10 mg Rectal Daily PRN Hooten, Laurice Record, MD      . ceFAZolin (ANCEF) 3 g in dextrose 5 % 50 mL IVPB  3 g Intravenous Q6H Hooten, Laurice Record, MD 100 mL/hr at 03/24/18 0454 3 g at 03/24/18 0454  . celecoxib (CELEBREX) capsule 200 mg  200 mg Oral BID Hooten, Laurice Record, MD      . diphenhydrAMINE (BENADRYL) 12.5 MG/5ML elixir 12.5-25 mg  12.5-25 mg Oral Q4H PRN Hooten, Laurice Record, MD      . enoxaparin (LOVENOX) injection 30 mg  30 mg Subcutaneous Q12H Hooten, Laurice Record, MD   30 mg at 03/24/18 0844  . ferrous sulfate tablet 325 mg  325 mg Oral BID WC Hooten, Laurice Record, MD   325 mg at 03/24/18 0844  . gabapentin (NEURONTIN) capsule 300 mg  300 mg Oral QHS Hooten, Laurice Record, MD      . HYDROmorphone (DILAUDID) injection 0.5-1 mg  0.5-1 mg Intravenous Q4H PRN Hooten, Laurice Record, MD      . loratadine (CLARITIN) tablet 10 mg  10 mg Oral Daily Hooten, Laurice Record, MD      . magnesium hydroxide (MILK OF MAGNESIA) suspension 30 mL  30 mL Oral Daily Hooten, Laurice Record, MD      . menthol-cetylpyridinium (CEPACOL) lozenge 3 mg  1 lozenge Oral PRN Hooten, Laurice Record, MD       Or  . phenol (CHLORASEPTIC) mouth spray 1  spray  1 spray Mouth/Throat PRN Hooten, Laurice Record, MD      . metoCLOPramide (REGLAN) tablet 5-10 mg  5-10 mg Oral Q8H PRN Hooten, Laurice Record, MD       Or  . metoCLOPramide (REGLAN) injection 5-10 mg  5-10 mg Intravenous Q8H PRN Hooten, Laurice Record, MD      . metoCLOPramide (REGLAN) tablet 10 mg  10 mg Oral TID AC & HS Hooten, Laurice Record, MD   10 mg at 03/24/18 0844  . multivitamin with minerals tablet 1 tablet  1 tablet Oral Daily Hooten, Laurice Record, MD      . ondansetron (ZOFRAN) tablet 4 mg  4 mg Oral Q6H PRN Hooten, Laurice Record, MD       Or  . ondansetron (ZOFRAN) injection 4 mg  4 mg Intravenous Q6H PRN Hooten, Laurice Record, MD      . oxyCODONE (Oxy IR/ROXICODONE) immediate release tablet 10 mg  10 mg Oral Q4H PRN Dereck Leep, MD   10 mg at 03/24/18 0332  . oxyCODONE (Oxy IR/ROXICODONE) immediate release tablet 5 mg  5 mg Oral Q4H PRN Hooten, Laurice Record, MD      . pantoprazole (PROTONIX) EC tablet 40 mg  40 mg Oral BID Dereck Leep, MD   40 mg at 03/23/18 2311  . senna-docusate (Senokot-S) tablet 1 tablet  1 tablet Oral BID Dereck Leep, MD   1 tablet at 03/23/18 2311  . sodium phosphate (FLEET) 7-19 GM/118ML enema 1 enema  1 enema Rectal Once PRN Hooten, Laurice Record, MD      . sucralfate (CARAFATE) tablet 1 g  1 g Oral BID Hooten, Laurice Record, MD   1 g at 03/23/18 2311  . traMADol (ULTRAM) tablet 50-100 mg  50-100 mg Oral Q4H PRN Hooten, Laurice Record, MD      . vitamin C (ASCORBIC ACID) tablet 1,000 mg  1,000 mg Oral Daily Hooten, Laurice Record, MD         Discharge Medications: Please see discharge summary for a list of discharge medications.  Relevant Imaging Results:  Relevant Lab Results:   Additional Information (SSN: [id number])  Solace Wendorff, Veronia Beets, LCSW

## 2018-03-24 NOTE — Evaluation (Signed)
Occupational Therapy Evaluation Patient Details Name: Nicholas Hobbs MRN: [id number] DOB: 05-29-1970 Today's Date: 03/24/2018    History of Present Illness 47yo male POD#1 s/p L TKA.    Clinical Impression   Pt seen for OT evaluation this date, POD#1 from above surgery. Pt was independent in all ADLs prior to surgery, however experiencing increasing pain in L knee. Pt is eager to return to PLOF with less pain and improved safety and independence. Pt is a local flat bed truck driver and lives with his girlfriend who will be able to provide assist once home. Pt currently requires minimal assist for LB dressing and bathing while in seated position due to pain and limited AROM of L knee. Pt/gf instructed in polar care mgt, falls prevention strategies, home/routines modifications, DME/AE for LB bathing and dressing tasks, and compression stocking mgt. Pt mod I for bed mobility. Once sitting EOB for approx 5 min, pt reported feeling "whoozy", requested to lay back down. Once supine, BP taken 93/62, HR 64, O2 98% on RA. Consecutive BP with HOB elevated and SCDs applied, 108/76, 87/69, 94/70. Once back in bed, pt reporting feeling better. RN notified promptly. Pt would benefit from skilled OT services including additional instruction in dressing techniques with or without assistive devices for dressing and bathing skills to support recall and carryover prior to discharge and ultimately to maximize safety, independence, and minimize falls risk and caregiver burden. Do not currently anticipate any OT needs following this hospitalization.      Follow Up Recommendations  No OT follow up    Equipment Recommendations  3 in 1 bedside commode;Other (comment)(reacher)    Recommendations for Other Services       Precautions / Restrictions Precautions Precautions: Knee;Fall Required Braces or Orthoses: (no KI needed, indep w/ [id number]) Restrictions Weight Bearing Restrictions: Yes LLE Weight Bearing: Weight  bearing as tolerated      Mobility Bed Mobility Overal bed mobility: Modified Independent             General bed mobility comments: +time w/ pain  Transfers                 General transfer comment: unable to attempt 2/2 to feeling "whoozy" and needing to lay back down    Balance                                           ADL either performed or assessed with clinical judgement   ADL Overall ADL's : Needs assistance/impaired                                       General ADL Comments: indep with seated UB ADL, Min A for LB ADL, Max A for compression stocking mgt -- girlfriend able to provide     Vision Baseline Vision/History: Wears glasses Wears Glasses: At all times(or contacts) Patient Visual Report: No change from baseline       Perception     Praxis      Pertinent Vitals/Pain Pain Assessment: 0-10 Pain Score: 8  Pain Location: 3 @ rest, 7-8 after bed mobility Pain Descriptors / Indicators: Aching;Discomfort;Grimacing Pain Intervention(s): Limited activity within patient's tolerance;Monitored during session;Repositioned;Ice applied;Patient requesting pain meds-RN notified     Hand Dominance Right   Extremity/Trunk Assessment Upper Extremity  Assessment Upper Extremity Assessment: Overall WFL for tasks assessed   Lower Extremity Assessment Lower Extremity Assessment: Overall WFL for tasks assessed(expected post-op strength/ROM deficits)   Cervical / Trunk Assessment Cervical / Trunk Assessment: Normal   Communication Communication Communication: No difficulties   Cognition Arousal/Alertness: Awake/alert Behavior During Therapy: WFL for tasks assessed/performed Overall Cognitive Status: Within Functional Limits for tasks assessed                                     General Comments  after sitting EOB for approx 5 min, pt reported feeling "whoozy", requested to lay back down. Once supine, BP  taken 93/62, HR 64, O2 98% on RA. Consecutive BP with HOB elevated and SCDs applied, 108/76, 87/69, 94/70. Once back in bed, pt reporting feeling better. RN notified promptly.     Exercises Other Exercises Other Exercises: pt/girlfriend educated in AE for ADL, home/routines modifications, polar care mgt, compression stocking mgt, self care skills, falls prevention strategies, and RW safety - pt/gf verbalized understanding   Shoulder Instructions      Home Living Family/patient expects to be discharged to:: Private residence Living Arrangements: Spouse/significant other(girlfriend) Available Help at Discharge: Family;Available 24 hours/day Type of Home: House Home Access: Stairs to enter [employer] of Steps: 5 Entrance Stairs-Rails: Right;Left;Can reach both Home Layout: One level     Bathroom Shower/Tub: Occupational psychologist: Standard     Home Equipment: None   Additional Comments: has access to a BSC      Prior Functioning/Environment Level of Independence: Independent        Comments: Indep in all aspects, no falls, drives, works as a Radiation protection practitioner Problem List: Decreased strength;Pain;Decreased range of motion;Cardiopulmonary status limiting activity      OT Treatment/Interventions: Self-care/ADL training;Balance training;Therapeutic exercise;Therapeutic activities;DME and/or AE instruction;Patient/family education    OT Goals(Current goals can be found in the care plan section) Acute Rehab OT Goals Patient Stated Goal: to feel better and get back to work OT Goal Formulation: With patient/family Time For Goal Achievement: 04/07/18 Potential to Achieve Goals: Good  OT Frequency: Min 1X/week   Barriers to D/C:            Co-evaluation              AM-PAC OT "6 Clicks" Daily Activity     Outcome Measure Help from another person eating meals?: None Help from another person taking care of personal  grooming?: None Help from another person toileting, which includes using toliet, bedpan, or urinal?: A Little Help from another person bathing (including washing, rinsing, drying)?: A Little Help from another person to put on and taking off regular upper body clothing?: None Help from another person to put on and taking off regular lower body clothing?: A Little 6 Click Score: 21   End of Session Nurse Communication: Other (comment)(BP)  Activity Tolerance: Patient tolerated treatment well(limited by BP) Patient left: in bed;with call bell/phone within reach;with bed alarm set;with family/visitor present;with SCD's reapplied;Other (comment)(bone foam, polar care in place)  OT Visit Diagnosis: Other abnormalities of gait and mobility (R26.89);Pain Pain - Right/Left: Left Pain - part of body: Knee                Time: 3818-2993 OT Time Calculation (min): 48 min Charges:  OT General Charges $OT Visit: 1 Visit  OT Evaluation $OT Eval Low Complexity: 1 Low OT Treatments $Self Care/Home Management : 38-52 mins  Jeni Salles, MPH, MS, OTR/L ascom [phone] 03/24/18, 10:01 AM

## 2018-03-24 NOTE — Progress Notes (Signed)
   Subjective: 1 Day Post-Op Procedure(s) (LRB): COMPUTER ASSISTED TOTAL KNEE ARTHROPLASTY (Left) Patient reports pain as mild.  Did have some pain during the night but is controlled with oral medication. Patient is well, and has had no acute complaints or problems We will start therapy today.  Plan is to go Home after hospital stay. no nausea and no vomiting Patient denies any chest pains or shortness of breath. Objective: Vital signs in last 24 hours: Temp:  [97 F (36.1 C)-98.6 F (37 C)] 97.5 F (36.4 C) (12/12 0307) Pulse Rate:  [58-91] 60 (12/12 0420) Resp:  [14-20] 18 (12/12 0420) BP: (95-119)/(61-88) 105/70 (12/12 0420) SpO2:  [93 %-99 %] 97 % (12/12 0420) Weight:  [136.3 kg] 136.3 kg (12/12 0307) Heels are non tender and elevated off the bed using rolled towels along with bone foam under operative heel  Intake/Output from previous day: 12/11 0701 - 12/12 0700 In: 2444.9 [I.V.:2294.9; IV Piggyback:150] Out: 1915 [Urine:1625; Drains:240; Blood:50] Intake/Output this shift: Total I/O In: 1444.9 [I.V.:1294.9; IV Piggyback:150] Out: 1915 [Urine:1625; Drains:240; Blood:50]  No results for input(s): HGB in the last 72 hours. No results for input(s): WBC, RBC, HCT, PLT in the last 72 hours. No results for input(s): NA, K, CL, CO2, BUN, CREATININE, GLUCOSE, CALCIUM in the last 72 hours. No results for input(s): LABPT, INR in the last 72 hours.  EXAM General - Patient is Alert, Appropriate and Oriented Extremity - Neurologically intact Neurovascular intact Sensation intact distally Intact pulses distally Dorsiflexion/Plantar flexion intact Compartment soft Dressing - dressing C/D/I Motor Function - intact, moving foot and toes well on exam.    Past Medical History:  Diagnosis Date  . GERD (gastroesophageal reflux disease)   . Pancreatitis   . Rib fracture   . Shoulder fracture, right   . Sleep apnea     Assessment/Plan: 1 Day Post-Op Procedure(s)  (LRB): COMPUTER ASSISTED TOTAL KNEE ARTHROPLASTY (Left) Active Problems:   Total knee replacement status  Estimated body mass index is 36.58 kg/m as calculated from the following:   Height as of 03/09/18: 6\' 4"  (1.93 m).   Weight as of this encounter: 136.3 kg. Advance diet Up with therapy D/C IV fluids Plan for discharge tomorrow Discharge home with home health  Labs: None DVT Prophylaxis - Lovenox, Foot Pumps and TED hose Weight-Bearing as tolerated to left leg D/C O2 and Pulse OX and try on Room Air Begin working on bowel movement  Jon R. Ko Olina Aguila 03/24/2018, 6:46 AM

## 2018-03-24 NOTE — Progress Notes (Addendum)
Physical Therapy Treatment Patient Details Name: Nicholas Hobbs MRN: [id number] DOB: 10-11-1970 Today's Date: 03/24/2018    History of Present Illness Pt is 47 yo male s/p L TKA POD #1, WBAT. PMH of GERD, sleep apnea.    PT Comments    Patient up in chair, agreeable to PT, mild complaints of L knee pain, 4-5/10. Pt able to complete therapeutic exercises without physical assist, minimal verbal cues. Demonstrated transfers during session with supervision-CGA and RW, steady. Pt ambulated ~222ft with RW and CGA, exhibited step through gait pattern and decreased UE weight bearing with verbal cues. TKE on LLE improved with verbal cues as well. Pt navigated 3 steps with rails x2 with supervision-CGA safely, minimal verbal cues for sequencing. The patient demonstrated improved functional mobility compared to previous session and would continue to benefit from skilled PT intervention to progress towards goals and PLOF.      Follow Up Recommendations  Home health PT     Equipment Recommendations  Rolling walker with 5" wheels    Recommendations for Other Services       Precautions / Restrictions Precautions Precautions: Knee;Fall Required Braces or Orthoses: (no KI needed) Restrictions Weight Bearing Restrictions: Yes LLE Weight Bearing: Weight bearing as tolerated    Mobility  Bed Mobility Overal bed mobility: Independent             General bed mobility comments: HOB flat  Transfers Overall transfer level: Needs assistance Equipment used: Rolling walker (2 wheeled) Transfers: Sit to/from Stand Sit to Stand: Supervision;Min guard            Ambulation/Gait Ambulation/Gait assistance: Min guard Gait Distance (Feet): 250 Assistive device: Rolling walker (2 wheeled)       General Gait Details: Cues for heel strike, decreased weight bearing on UE, able to progress to step through gait pattern and maintain. Verbal cues for TKE on LLE as well   Stairs Stairs:  Yes Stairs assistance: Min guard;Supervision Stair Management: Two rails;Step to pattern Number of Stairs: 6 General stair comments: good balance, CGA-supervision, steady   Wheelchair Mobility    Modified Rankin (Stroke Patients Only)       Balance Overall balance assessment: Needs assistance Sitting-balance support: Feet supported Sitting balance-Leahy Scale: Good     Standing balance support: No upper extremity supported Standing balance-Leahy Scale: Fair                              Cognition Arousal/Alertness: Awake/alert Behavior During Therapy: WFL for tasks assessed/performed Overall Cognitive Status: Within Functional Limits for tasks assessed                                        Exercises Total Joint Exercises Ankle Circles/Pumps: AROM;20 reps Quad Sets: AROM;5 reps;Left Short Arc Quad: AROM;Strengthening;Left;15 reps Hip ABduction/ADduction: AROM;Strengthening;Left;10 reps Straight Leg Raises: AROM;Strengthening;Left;15 reps Long Arc Quad: AROM;Strengthening;Left;10 reps Knee Flexion: AROM;Strengthening;10 reps     General Comments      Pertinent Vitals/Pain Pain Assessment: 0-10 Pain Score: 5  Pain Location: L knee Pain Descriptors / Indicators: Aching;Sore;Tender Pain Intervention(s): Limited activity within patient's tolerance;Monitored during session;Ice applied;Repositioned    Home Living Family/patient expects to be discharged to:: Private residence Living Arrangements: Spouse/significant other Available Help at Discharge: Family;Available 24 hours/day Type of Home: House Home Access: Stairs to enter Entrance Stairs-Rails: Right;Left;Can reach both Home  Layout: One level Home Equipment: None      Prior Function Level of Independence: Independent         PT Goals (current goals can now be found in the care plan section) Acute Rehab PT Goals Patient Stated Goal: to return to PLOF PT Goal Formulation:  With patient Time For Goal Achievement: 04/07/18 Potential to Achieve Goals: Good Progress towards PT goals: Progressing toward goals    Frequency    BID      PT Plan Current plan remains appropriate    Co-evaluation              AM-PAC PT "6 Clicks" Mobility   Outcome Measure  Help needed turning from your back to your side while in a flat bed without using bedrails?: None Help needed moving from lying on your back to sitting on the side of a flat bed without using bedrails?: None Help needed moving to and from a bed to a chair (including a wheelchair)?: A Little Help needed standing up from a chair using your arms (e.g., wheelchair or bedside chair)?: None Help needed to walk in hospital room?: A Little Help needed climbing 3-5 steps with a railing? : A Little 6 Click Score: 21    End of Session Equipment Utilized During Treatment: Gait belt Activity Tolerance: Patient tolerated treatment well Patient left: in chair;with call bell/phone within reach;with SCD's reapplied;with family/visitor present;with chair alarm set Nurse Communication: Mobility status PT Visit Diagnosis: Other abnormalities of gait and mobility (R26.89);Pain;Muscle weakness (generalized) (M62.81);Difficulty in walking, not elsewhere classified (R26.2) Pain - Right/Left: Left Pain - part of body: Leg     Time: 1351-1433 PT Time Calculation (min) (ACUTE ONLY): 42 min  Charges:  $Gait Training: 8-22 mins $Therapeutic Exercise: 8-22 mins $Therapeutic Activity: 8-22 mins                     Lieutenant Diego PT, DPT 3:35 PM,03/24/18 [phone]

## 2018-03-24 NOTE — Progress Notes (Signed)
Clinical Social Worker (CSW) received SNF consult. PT is recommending home health. RN case manager aware of above. Please reconsult if future social work needs arise. CSW signing off.   Latondra Gebhart, LCSW [phone] 

## 2018-03-24 NOTE — Care Management (Signed)
Dr. Marry Guan would like Lovenox 40mg  injection daily for 14 days no refills called in to patient's pharmacy. RNCM spoke with patient and he said that he uses [facility] [phone] for medications.  RNCM has called in Lovenox as directed. Assessment to follow.

## 2018-03-24 NOTE — Progress Notes (Signed)
Pt has a total of four scheduled Cefazolin and IV Acetaminophen. MD Hooten notified, per MD pt should only received 3 total doses of each, 4th dose not given.

## 2018-03-24 NOTE — Evaluation (Addendum)
Physical Therapy Evaluation Patient Details Name: Nicholas Hobbs MRN: [id number] DOB: 11/29/1970 Today's Date: 03/24/2018   History of Present Illness  Pt is 47 yo male s/p L TKA POD #1, WBAT. PMH of GERD, sleep apnea.  Clinical Impression  Patient alert and agreeable to PT, L knee pain 7/10 with mobility. Pt reported living in 1 story home with significant other, 5 STE with rails, prior to admission independent in ambulation, ADLs, IADLs.   Orthostatic vitals performed, stable throughout mobility, pt asymptomatic with vitals WFLs. Pt demonstrated bed mobility independently, transferred with CGA, and ambulated ~116ft with RW and CGA. Patient demonstrated step to gait pattern, able to progress to step through gait pattern, but still with increased weight bearing through UE due to pain. Occasional verbal cues for upright posture and TKE on L TKA. Pt needed intermittent verbal cues for breathing during session. The patient demonstrated limitations (see "PT Problem List") that impede patient's ability to perform functional activities and would benefit from skilled PT intervention to address these and return to PLOF. Recommendation is HHPT.     Follow Up Recommendations Home health PT    Equipment Recommendations  Rolling walker with 5" wheels    Recommendations for Other Services       Precautions / Restrictions Precautions Precautions: Knee;Fall Required Braces or Orthoses: (no KI needed) Restrictions Weight Bearing Restrictions: Yes LLE Weight Bearing: Weight bearing as tolerated      Mobility  Bed Mobility Overal bed mobility: Independent             General bed mobility comments: HOB flat due to orthostatic readings.  Transfers Overall transfer level: Needs assistance Equipment used: Rolling walker (2 wheeled) Transfers: Sit to/from Stand Sit to Stand: Min guard           Ambulation/Gait Ambulation/Gait assistance: Min guard Gait Distance (Feet): 180  Feet Assistive device: Rolling walker (2 wheeled)       General Gait Details: Pt with antalgic gait on LLE, weight bearing through UE flucuated throughout ambulation due to pain, decreased heel strike and stride length. Gait velocity decreased. Pt able to attempt step through gait pattern.  Stairs            Wheelchair Mobility    Modified Rankin (Stroke Patients Only)       Balance Overall balance assessment: Needs assistance Sitting-balance support: Feet supported Sitting balance-Leahy Scale: Good       Standing balance-Leahy Scale: Fair                               Pertinent Vitals/Pain Pain Assessment: 0-10 Pain Score: 7  Pain Location: L medial knee Pain Descriptors / Indicators: Aching;Sore;Tender Pain Intervention(s): Monitored during session;Ice applied;Limited activity within patient's tolerance;Repositioned    Home Living Family/patient expects to be discharged to:: Private residence Living Arrangements: Spouse/significant other Available Help at Discharge: Family;Available 24 hours/day Type of Home: House Home Access: Stairs to enter Entrance Stairs-Rails: Right;Left;Can reach both Entrance Stairs-Number of Steps: 5 Home Layout: One level Home Equipment: None Additional Comments: has access to a BSC    Prior Function Level of Independence: Independent         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, works as Nurse, adult   Dominant Hand: Right    Extremity/Trunk Assessment   Upper Extremity Assessment Upper Extremity Assessment: Overall WFL for tasks assessed    Lower Extremity Assessment Lower Extremity Assessment: RLE  deficits/detail;LLE deficits/detail RLE Deficits / Details: grossly 4+/5 LLE Deficits / Details: no formal MMT due to POD #1 TKA    Cervical / Trunk Assessment Cervical / Trunk Assessment: Normal  Communication   Communication: No difficulties  Cognition Arousal/Alertness: Awake/alert Behavior  During Therapy: WFL for tasks assessed/performed Overall Cognitive Status: Within Functional Limits for tasks assessed                                        General Comments General comments (skin integrity, edema, etc.): Pt with no complaints of dizziness/lightheadedness with mobility this session.    Exercises Total Joint Exercises Ankle Circles/Pumps: AROM;20 reps Quad Sets: AROM;5 reps;Left Long Arc Quad: AROM;Strengthening;Left;10 reps Knee Flexion: AROM;Strengthening;10 reps Goniometric ROM: -5 to 60deg L knee Other Exercises Other Exercises: Pt educated on proper AD sizing   Assessment/Plan    PT Assessment Patient needs continued PT services  PT Problem List Decreased strength;Pain;Decreased range of motion;Decreased activity tolerance;Decreased knowledge of use of DME;Decreased balance;Decreased mobility;Decreased knowledge of precautions       PT Treatment Interventions DME instruction;Therapeutic exercise;Gait training;Balance training;Stair training;Neuromuscular re-education;Functional mobility training;Therapeutic activities;Patient/family education    PT Goals (Current goals can be found in the Care Plan section)  Acute Rehab PT Goals Patient Stated Goal: to return to PLOF PT Goal Formulation: With patient Time For Goal Achievement: 04/07/18 Potential to Achieve Goals: Good    Frequency BID   Barriers to discharge        Co-evaluation               AM-PAC PT "6 Clicks" Mobility  Outcome Measure Help needed turning from your back to your side while in a flat bed without using bedrails?: None Help needed moving from lying on your back to sitting on the side of a flat bed without using bedrails?: None Help needed moving to and from a bed to a chair (including a wheelchair)?: A Little Help needed standing up from a chair using your arms (e.g., wheelchair or bedside chair)?: A Little Help needed to walk in hospital room?: A Little Help  needed climbing 3-5 steps with a railing? : A Little 6 Click Score: 20    End of Session Equipment Utilized During Treatment: Gait belt Activity Tolerance: Patient tolerated treatment well Patient left: in chair;with call bell/phone within reach;with SCD's reapplied;with family/visitor present;with chair alarm set Nurse Communication: Mobility status PT Visit Diagnosis: Other abnormalities of gait and mobility (R26.89);Pain;Muscle weakness (generalized) (M62.81);Difficulty in walking, not elsewhere classified (R26.2) Pain - Right/Left: Left Pain - part of body: Leg    Time: 0932-3557 PT Time Calculation (min) (ACUTE ONLY): 48 min   Charges:   PT Evaluation $PT Eval Low Complexity: 1 Low PT Treatments $Gait Training: 8-22 mins $Therapeutic Activity: 8-22 mins        Lieutenant Diego PT, DPT 1:28 PM,03/24/18 [phone]

## 2018-03-24 NOTE — Care Management Note (Signed)
Case Management Note  Patient Details  Name: Nicholas Hobbs MRN: [id number] Date of Birth: January 27, 1971  Subjective/Objective:  RNCM team consulted with patient to assist with transition of care planning. Patient agreeable to home health services. CMS Medicare.gov Compare Post Acute Care list reviewed with patient. Patient familiar with [facility] and would prefer to use them as he knows Dr Marry Guan is affiliated with them. Referral placed with Helene Kelp for PT services. Patient has a bedside commode but needs a rolling walker, obtained form [facility], awaiting delivery to the bedside. Patient lives with spouse and she will provide discharge transportation. Will confirm lovenox pricing before discharge.                  Action/Plan:   Expected Discharge Date:                  Expected Discharge Plan:  [place]  In-House Referral:     Discharge planning Services  CM Consult  Post Acute Care Choice:  Durable Medical Equipment, Home Health Choice offered to:  Patient  DME Arranged:  Walker rolling DME Agency:  [place]:  PT [place]:  [facility] ([facility])  Status of Service:  Completed, signed off  If discussed at [employer] of Stay Meetings, dates discussed:    Additional Comments:  Artice Holohan Loni Muse Matthan Sledge, RN 03/24/2018, 11:02 AM

## 2018-03-25 NOTE — Progress Notes (Signed)
   Subjective: 2 Days Post-Op Procedure(s) (LRB): COMPUTER ASSISTED TOTAL KNEE ARTHROPLASTY (Left) Patient reports pain as mild.   Patient is well, and has had no acute complaints or problems Patient did very well with therapy yesterday.  Was able to ambulate around the nurses test and do steps.  Range of motion however is a little bit behind 0 to 60 degrees  Plan is to go Home after hospital stay. no nausea and no vomiting Patient denies any chest pains or shortness of breath. Objective: Vital signs in last 24 hours: Temp:  [97.5 F (36.4 C)-98.4 F (36.9 C)] 97.7 F (36.5 C) (12/13 0407) Pulse Rate:  [58-76] 59 (12/13 0407) Resp:  [16-18] 16 (12/13 0407) BP: (101-119)/(59-77) 101/59 (12/13 0407) SpO2:  [94 %-97 %] 97 % (12/13 0407) well approximated incision Heels are non tender and elevated off the bed using rolled towels Intake/Output from previous day: 12/12 0701 - 12/13 0700 In: 100  Out: 1860 [Urine:1400; Drains:460] Intake/Output this shift: Total I/O In: 100 [Other:100] Out: 1030 [Urine:800; Drains:230]  No results for input(s): HGB in the last 72 hours. No results for input(s): WBC, RBC, HCT, PLT in the last 72 hours. No results for input(s): NA, K, CL, CO2, BUN, CREATININE, GLUCOSE, CALCIUM in the last 72 hours. No results for input(s): LABPT, INR in the last 72 hours.  EXAM General - Patient is Alert, Appropriate and Oriented Extremity - Neurologically intact Neurovascular intact Sensation intact distally Intact pulses distally Dorsiflexion/Plantar flexion intact No cellulitis present Compartment soft Dressing - moderate drainage to the lower incision site  Motor Function - intact, moving foot and toes well on exam.    Past Medical History:  Diagnosis Date  . GERD (gastroesophageal reflux disease)   . Pancreatitis   . Rib fracture   . Shoulder fracture, right   . Sleep apnea     Assessment/Plan: 2 Days Post-Op Procedure(s) (LRB): COMPUTER  ASSISTED TOTAL KNEE ARTHROPLASTY (Left) Active Problems:   Total knee replacement status  Estimated body mass index is 36.58 kg/m as calculated from the following:   Height as of 03/09/18: 6\' 4"  (1.93 m).   Weight as of this encounter: 136.3 kg. Up with therapy Discharge home with home health  Labs: None DVT Prophylaxis - Lovenox, Foot Pumps and TED hose Weight-Bearing as tolerated to left leg Hemovac was discontinued today.  Ends of the drain appeared to be intact. Please wash operative leg, change dressing up and apply TED stockings to both legs Please give the patient 2 extra honeycomb dressings take home  [place]. [place] 03/25/2018, 6:53 AM

## 2018-03-25 NOTE — Progress Notes (Signed)
Physical Therapy Treatment Patient Details Name: Nicholas Hobbs MRN: [id number] DOB: 1970/08/30 Today's Date: 03/25/2018    History of Present Illness Pt is 47 yo male s/p L TKA POD #2, WBAT. PMH of GERD, sleep apnea.    PT Comments    Patient alert in bed, agreeable to PT at start of session, L knee pain 5/10. Pt most challenged by supine heel slides due to pain, able to complete all therapeutic exercises without physical assist and minimal verbal cues, ROM this AM 0-90deg, much improved. Pt ambulated ~441ft with RW and supervision. Verbal cues for heel strike and TKE in stance on LLE. Pt was also able to progress to a step through gait pattern allowing the RW to glide continuously with moderate verbal cues as well, instructed to perform as pain as able. The patient would benefit from further skilled PT to continue to progress towards goals and PLOF.       Follow Up Recommendations  Home health PT     Equipment Recommendations  Rolling walker with 5" wheels    Recommendations for Other Services       Precautions / Restrictions Precautions Precautions: Knee;Fall Restrictions Weight Bearing Restrictions: Yes LLE Weight Bearing: Weight bearing as tolerated    Mobility  Bed Mobility Overal bed mobility: Modified Independent             General bed mobility comments: HOB elevated  Transfers Overall transfer level: Needs assistance Equipment used: Rolling walker (2 wheeled) Transfers: Sit to/from Stand Sit to Stand: Supervision         General transfer comment: Pt needed minimal to no verbal cues for hand placement  Ambulation/Gait Ambulation/Gait assistance: Supervision Gait Distance (Feet): 400 Feet Assistive device: Rolling walker (2 wheeled)       General Gait Details: Verbal cues for heel strike and TKE on LLE. Pt able to progress intermittently to allowing RW to glide on floor instead of heavily relying on UE to decrease weight bearing through LE with  verbal cues. Pt steady throughout   Stairs             Wheelchair Mobility    Modified Rankin (Stroke Patients Only)       Balance   Sitting-balance support: Feet supported Sitting balance-Leahy Scale: Good     Standing balance support: No upper extremity supported Standing balance-Leahy Scale: Fair                              Cognition Arousal/Alertness: Awake/alert Behavior During Therapy: WFL for tasks assessed/performed Overall Cognitive Status: Within Functional Limits for tasks assessed                                        Exercises Total Joint Exercises Ankle Circles/Pumps: AROM;20 reps Quad Sets: AROM;Strengthening;15 reps;Left Heel Slides: AROM;Strengthening;10 reps;Left Straight Leg Raises: AROM;Strengthening;15 reps;Left Knee Flexion: AROM;Strengthening;15 reps;Left Goniometric ROM: 0-90deg L knee Other Exercises Other Exercises: pt utilized commode with supervision during session    General Comments General comments (skin integrity, edema, etc.): washed hands at sink unsupported UE      Pertinent Vitals/Pain Pain Assessment: 0-10 Pain Score: 5  Pain Location: L knee Pain Descriptors / Indicators: Aching Pain Intervention(s): Monitored during session;Repositioned;Limited activity within patient's tolerance;Ice applied    Home Living  Prior Function            PT Goals (current goals can now be found in the care plan section) Progress towards PT goals: Progressing toward goals    Frequency    BID      PT Plan Current plan remains appropriate    Co-evaluation              AM-PAC PT "6 Clicks" Mobility   Outcome Measure  Help needed turning from your back to your side while in a flat bed without using bedrails?: None Help needed moving from lying on your back to sitting on the side of a flat bed without using bedrails?: None Help needed moving to and from a bed  to a chair (including a wheelchair)?: A Little Help needed standing up from a chair using your arms (e.g., wheelchair or bedside chair)?: None Help needed to walk in hospital room?: A Little Help needed climbing 3-5 steps with a railing? : A Little 6 Click Score: 21    End of Session Equipment Utilized During Treatment: Gait belt Activity Tolerance: Patient tolerated treatment well Patient left: in chair;with call bell/phone within reach;with SCD's reapplied;with family/visitor present;with chair alarm set Nurse Communication: Mobility status PT Visit Diagnosis: Other abnormalities of gait and mobility (R26.89);Pain;Muscle weakness (generalized) (M62.81);Difficulty in walking, not elsewhere classified (R26.2) Pain - Right/Left: Left Pain - part of body: Leg     Time: 0973-5329 PT Time Calculation (min) (ACUTE ONLY): 35 min  Charges:  $Gait Training: 8-22 mins $Therapeutic Activity: 8-22 mins                     Lieutenant Diego PT, DPT 9:34 AM,03/25/18 [phone]

## 2018-03-25 NOTE — Progress Notes (Signed)
DISCHARGE NOTE:  Pt given discharge instructions and scripts ( tramadol, oxycodone, lovenox).  Surgical dressing changed, extra honeycomb sent with pt. TED hose on both legs. VSS. Walker sent with pt.  Pt waiting on ride.

## 2018-03-25 NOTE — Discharge Summary (Signed)
Physician Discharge Summary  Patient ID: Nicholas Hobbs MRN: [id number] DOB/AGE: 1971-02-20 47 y.o.  Admit date: 03/23/2018 Discharge date: 03/25/2018  Admission Diagnoses:  PRIMARY OSTEOARTHRITIS OF LEFT KNEE   Discharge Diagnoses: Patient Active Problem List   Diagnosis Date Noted  . 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 Fracture of scapula 07/21/2017  . Erectile dysfunction 07/15/2015  . Current tear knee, medial meniscus 05/08/2014  . Arthritis of knee, degenerative 05/08/2014  . Pancreatitis, recurrent 02/01/2012  . PANCREATITIS 04/30/2008  . SLEEP APNEA 04/30/2008    Past Medical History:  Diagnosis Date  . GERD (gastroesophageal reflux disease)   . Pancreatitis   . Rib fracture   . Shoulder fracture, right   . Sleep apnea      Transfusion: Transfusion during this admission   Consultants (if any):   Discharged Condition: Improved  Hospital Course: Nicholas Hobbs is an 47 y.o. male who was admitted 03/23/2018 with a diagnosis of degenerative arthrosis left knee and went to the operating room on 03/23/2018 and underwent the above named procedures.    Surgeries:Procedure(s): COMPUTER ASSISTED TOTAL KNEE ARTHROPLASTY on 03/23/2018   PRE-OPERATIVE DIAGNOSIS: Degenerative arthrosis of the left knee, primary  POST-OPERATIVE DIAGNOSIS:  Same  PROCEDURE:  Left total knee arthroplasty using computer-assisted navigation  SURGEON:  Marciano Sequin. M.D.  ASSISTANT:  Vance Peper, PA (present and scrubbed throughout the case, critical for assistance with exposure, retraction, instrumentation, and closure)  ANESTHESIA: spinal  ESTIMATED BLOOD LOSS: 50 mL  FLUIDS REPLACED: 1700 mL of crystalloid  TOURNIQUET TIME: 104 minutes  DRAINS: 2 medium Hemovac drains  SOFT TISSUE RELEASES: Anterior cruciate ligament, posterior cruciate ligament, deep medial collateral ligament, patellofemoral ligament  IMPLANTS UTILIZED: DePuy Attune size 9 posterior  stabilized femoral component (cemented), size 9 rotating platform tibial component (cemented), 41 mm medialized dome patella (cemented), and a 5 mm stabilized rotating platform polyethylene insert.  INDICATIONS FOR SURGERY: Nicholas Hobbs is a 48 y.o. year old male with a long history of progressive knee pain. X-rays demonstrated severe degenerative changes in tricompartmental fashion. The patient had not seen any significant improvement despite conservative nonsurgical intervention. After discussion of the risks and benefits of surgical intervention, the patient expressed understanding of the risks benefits and agree with plans for total knee arthroplasty.   The risks, benefits, and alternatives were discussed at length including but not limited to the risks of infection, bleeding, nerve injury, stiffness, blood clots, the need for revision surgery, cardiopulmonary complications, among others, and they were willing to proceed. Patient tolerated the surgery well. No complications .Patient was taken to PACU where she was stabilized and then transferred to the orthopedic floor.  Patient started on Lovenox 30 mg q 12 hrs. Foot pumps applied bilaterally at 80 mm hgb. Heels elevated off bed with rolled towels. No evidence of DVT. Calves non tender. Negative Homan. Physical therapy started on day #1 for gait training and transfer with OT starting on  day #1 for ADL and assisted devices. Patient has done well with therapy. Ambulated greater than 200 feet upon being discharged.  Was able to ascend and descend 4 steps safely and independently  Patient's IV And Foley were discontinued on day #1 with Hemovac being discontinued on day #2. Dressing was changed on day 2 prior to patient being discharged   He was given perioperative antibiotics:  Anti-infectives (From admission, onward)   Start     Dose/Rate Route Frequency Ordered Stop   03/23/18 2300  ceFAZolin (  ANCEF) 3 g in dextrose 5 % 50 mL IVPB     3  g 100 mL/hr over 30 Minutes Intravenous Every 6 hours 03/23/18 2254 03/24/18 2259   03/23/18 1314  ceFAZolin (ANCEF) 2-4 GM/100ML-% IVPB    Note to Pharmacy:  Phineas Real   : cabinet override      03/23/18 1314 03/24/18 0129   03/23/18 0600  ceFAZolin (ANCEF) IVPB 2g/100 mL premix  Status:  Discontinued     2 g 200 mL/hr over 30 Minutes Intravenous On call to O.R. 03/22/18 2243 03/23/18 1325    .  He was fitted with AV 1 compression foot pump devices, instructed on heel pumps, early ambulation, and fitted with TED stockings bilaterally for DVT prophylaxis.  He benefited maximally from the hospital stay and there were no complications.    Recent vital signs:  Vitals:   03/24/18 2026 03/25/18 0407  BP: 108/64 (!) 101/59  Pulse: 63 (!) 59  Resp: 18 16  Temp: 98.4 F (36.9 C) 97.7 F (36.5 C)  SpO2: 95% 97%    Recent laboratory studies:  Lab Results  Component Value Date   HGB 15.5 03/20/2018   HGB 15.8 03/09/2018   HGB 15.4 12/01/2017   Lab Results  Component Value Date   WBC 8.6 03/20/2018   PLT 178 03/20/2018   Lab Results  Component Value Date   INR 1.04 03/09/2018   Lab Results  Component Value Date   NA 140 03/20/2018   K 4.1 03/20/2018   CL 109 03/20/2018   CO2 24 03/20/2018   BUN 19 03/20/2018   CREATININE 0.81 03/20/2018   GLUCOSE 107 (H) 03/20/2018    Discharge Medications:   Allergies as of 03/25/2018      Reactions   Cat Hair Extract Other (See Comments)   Watery eyes, Runny Nose, and Itching    Clarithromycin Itching   REACTION: rash      Medication List    STOP taking these medications   naproxen sodium 220 MG tablet Commonly known as:  ALEVE     TAKE these medications   cetirizine 10 MG tablet Commonly known as:  ZYRTEC Take 10 mg by mouth daily as needed for allergies.   enoxaparin 40 MG/0.4ML injection Commonly known as:  LOVENOX Inject 0.4 mLs (40 mg total) into the skin daily for 14 days. Start taking on:  March 26, 2018   multivitamin with minerals tablet Take 1 tablet by mouth daily.   Omeprazole 20 MG Tbec Take 20 mg by mouth daily.   oxyCODONE 5 MG immediate release tablet Commonly known as:  Oxy IR/ROXICODONE Take 1-2 tablets (5-10 mg total) by mouth every 6 (six) hours as needed for moderate pain (pain score 4-6).   sildenafil 20 MG tablet Commonly known as:  REVATIO TAKE 2-5 TABLETS BY MOUTH AS NEEDED FOR SEXUAL ACTIVITY   sucralfate 1 g tablet Commonly known as:  CARAFATE Take 1 tablet (1 g total) by mouth 2 (two) times daily for 15 days.   traMADol 50 MG tablet Commonly known as:  ULTRAM Take 1-2 tablets (50-100 mg total) by mouth every 4 (four) hours as needed for moderate pain.   vitamin C 1000 MG tablet Take 1,000 mg by mouth daily.            Durable Medical Equipment  (From admission, onward)         Start     Ordered   03/23/18 2255  DME Walker rolling  Once    Question:  Patient needs a walker to treat with the following condition  Answer:  Total knee replacement status   03/23/18 2254   03/23/18 2255  DME Bedside commode  Once    Question:  Patient needs a bedside commode to treat with the following condition  Answer:  Total knee replacement status   03/23/18 2254          Diagnostic Studies: Dg Chest 2 View  Result Date: 03/20/2018 CLINICAL DATA:  Chest and epigastric pain beginning yesterday. Personal history of pancreatitis. EXAM: CHEST - 2 VIEW COMPARISON:  12/02/2017 FINDINGS: The heart size and mediastinal contours are within normal limits. Both lungs are clear. The visualized skeletal structures are unremarkable. IMPRESSION: No active cardiopulmonary disease. Electronically Signed   By: Earle Gell M.D.   On: 03/20/2018 12:01   Dg Knee Left Port  Result Date: 03/23/2018 CLINICAL DATA:  May 14, 2014. EXAM: PORTABLE LEFT KNEE - 1-2 VIEW COMPARISON:  MRI LEFT knee May 14, 2014. FINDINGS: Status post total knee arthroplasty with intact well  seated femoral and tibial components, expected appearance of the resurfaced patella. No acute fracture deformity. No dislocation. No destructive bony lesions. Subcutaneous emphysema consistent with recent surgery with overlying skin staples. Surgical drains in place. IMPRESSION: Status post total knee arthroplasty with expected postoperative appearance. Surgical drains in place. Electronically Signed   By: Elon Alas M.D.   On: 03/23/2018 21:20    Disposition: Discharge disposition: 01-Home or Self Care       Discharge Instructions    Increase activity slowly   Complete by:  As directed    Increase activity slowly   Complete by:  As directed       Follow-up Information    Duanne Guess, PA-C On 04/08/2018.   Specialties:  Orthopedic Surgery, Emergency Medicine Why:  at 9:45am Contact information: [place] [address] [phone]            Signed: Watt Climes 03/25/2018, 7:01 AM

## 2018-03-25 NOTE — Care Management (Signed)
Lovenox $0.00 cost to patient; DME has been delivered. Helene Kelp with [facility] notified of patient discharge.

## 2018-03-29 NOTE — Care Management (Addendum)
Post discharge note entry 03/29/18- RNCM notified by Surgeon that family had shared with him that "no home health agency has seen patient".  Sonia Side and Helene Kelp with [facility] notified of this request. RNCM spoke with patient and confirmed. Patient said that he has received a call from [facility] today and will arrange an appointment.

## 2018-07-21 ENCOUNTER — Encounter: Payer: Self-pay | Admitting: Podiatry

## 2018-07-21 ENCOUNTER — Other Ambulatory Visit: Payer: Self-pay

## 2018-07-21 ENCOUNTER — Ambulatory Visit (INDEPENDENT_AMBULATORY_CARE_PROVIDER_SITE_OTHER): Payer: 59 | Admitting: Podiatry

## 2018-07-21 DIAGNOSIS — L600 Ingrowing nail: Secondary | ICD-10-CM

## 2018-07-21 MED ORDER — DOXYCYCLINE HYCLATE 100 MG PO TABS: 100.0000 mg | ORAL_TABLET | Freq: Two times a day (BID) | ORAL | 0 refills | Status: DC

## 2018-07-21 MED ORDER — NEOMYCIN-POLYMYXIN-HC 3.5-10000-1 OT SOLN: OTIC | 0 refills | Status: DC

## 2018-07-21 NOTE — Patient Instructions (Signed)
ANTIBACTERIAL SOAP INSTRUCTIONS  THE DAY AFTER PROCEDURE  Please follow the instructions your doctor has marked.   Shower as usual. Before getting out, place a drop of antibacterial liquid soap (Dial) on a wet, clean washcloth.  Gently wipe washcloth over affected area.  Afterward, rinse the area with warm water.  Blot the area dry with a soft cloth and cover with antibiotic ointment (neosporin, polysporin, bacitracin) and band aid or gauze and tape Place 3-4 drops of antibacterial liquid soap in a quart of warm tap water.  Submerge foot into water for 20 minutes.  If bandage was applied after your procedure, leave on to allow for easy lift off, then remove and continue with soak for the remaining time.  Next, blot area dry with a soft cloth and cover with a bandage.  Apply other medications as directed by your doctor, such as cortisporin otic solution (eardrops) or neosporin antibiotic ointmentBetadine Soak Instructions  Purchase an 8 oz. bottle of BETADINE solution (Povidone)  THE DAY AFTER THE PROCEDURE  Place 1 tablespoon of betadine solution in a quart of warm tap water.  Submerge your foot or feet with outer bandage intact for the initial soak; this will allow the bandage to become moist and wet for easy lift off.  Once you remove your bandage, continue to soak in the solution for 20 minutes.  This soak should be done twice a day.  Next, remove your foot or feet from solution, blot dry the affected area and cover.  You may use a band aid large enough to cover the area or use gauze and tape.  Apply other medications to the area as directed by the doctor such as cortisporin otic solution (ear drops) or neosporin.  IF YOUR SKIN BECOMES IRRITATED WHILE USING THESE INSTRUCTIONS, IT IS OKAY TO SWITCH TO EPSOM SALTS AND WATER OR WHITE VINEGAR AND WATER.

## 2018-07-21 NOTE — Progress Notes (Signed)
He presents today chief complaint of ingrown toenail tibial border hallux left.  States is been red and sore for the past several weeks.  Is done nothing to treat it and does not know the cause.  Objective: Vital signs are stable alert and oriented x3.  Pulses are palpable.  Neurologic sensorium is intact.  Sharply rated nail margin along the tibial border of the hallux left with an area of purulence which has dried up into a greenish colored abscess also there is some proximal erythema extending from the proximal nail fold medially up the tibial side of the nail.  Assessment: Ingrown nail tibial border hallux left with cellulitis.  Plan: Started him on doxycycline performed a chemical matrixectomy today to remove the tibial border.  He tolerated procedure well after local anesthetic was administered he was also given both oral and written home-going structures for the care and soaking of the toe as well as a prescription for Cortisporin Otic to be applied twice daily after soaking.  Follow-up with him in 2 weeks he will call with questions or concerns.

## 2018-08-04 ENCOUNTER — Ambulatory Visit: Payer: 59 | Admitting: Podiatry

## 2018-11-24 ENCOUNTER — Encounter: Payer: Self-pay | Admitting: *Deleted

## 2018-11-24 ENCOUNTER — Other Ambulatory Visit: Payer: Self-pay

## 2018-11-24 ENCOUNTER — Emergency Department
Admission: EM | Admit: 2018-11-24 | Discharge: 2018-11-24 | Disposition: A | Discharge status: Home / Self Care | Payer: 59 | Attending: Emergency Medicine | Admitting: Emergency Medicine

## 2018-11-24 ENCOUNTER — Emergency Department: Payer: 59

## 2018-11-24 DIAGNOSIS — R1013 Epigastric pain: Secondary | ICD-10-CM | POA: Diagnosis present

## 2018-11-24 DIAGNOSIS — R61 Generalized hyperhidrosis: Secondary | ICD-10-CM | POA: Diagnosis not present

## 2018-11-24 DIAGNOSIS — R079 Chest pain, unspecified: Secondary | ICD-10-CM | POA: Diagnosis not present

## 2018-11-24 DIAGNOSIS — Z87891 Personal history of nicotine dependence: Secondary | ICD-10-CM | POA: Insufficient documentation

## 2018-11-24 DIAGNOSIS — Z79899 Other long term (current) drug therapy: Secondary | ICD-10-CM | POA: Insufficient documentation

## 2018-11-24 DIAGNOSIS — K859 Acute pancreatitis without necrosis or infection, unspecified: Secondary | ICD-10-CM

## 2018-11-24 DIAGNOSIS — Z96652 Presence of left artificial knee joint: Secondary | ICD-10-CM | POA: Diagnosis not present

## 2018-11-24 DIAGNOSIS — R0602 Shortness of breath: Secondary | ICD-10-CM | POA: Diagnosis not present

## 2018-11-24 LAB — LIPASE, BLOOD: Lipase: 54 U/L — ABNORMAL HIGH (ref 11–51)

## 2018-11-24 LAB — BASIC METABOLIC PANEL
Anion gap: 9 (ref 5–15)
BUN: 17 mg/dL (ref 6–20)
CO2: 25 mmol/L (ref 22–32)
Calcium: 9 mg/dL (ref 8.9–10.3)
Chloride: 103 mmol/L (ref 98–111)
Creatinine, Ser: 0.98 mg/dL (ref 0.61–1.24)
GFR calc Af Amer: >60 mL/min (ref >60)
GFR calc non Af Amer: >60 mL/min (ref >60)
Glucose, Bld: 114 mg/dL — ABNORMAL HIGH (ref 70–99)
Potassium: 4.1 mmol/L (ref 3.5–5.1)
Sodium: 137 mmol/L (ref 135–145)

## 2018-11-24 LAB — HEPATIC FUNCTION PANEL
ALT: 18 U/L (ref 0–44)
AST: 22 U/L (ref 15–41)
Albumin: 4.4 g/dL (ref 3.5–5.0)
Alkaline Phosphatase: 64 U/L (ref 38–126)
Bilirubin, Direct: 0.3 mg/dL — ABNORMAL HIGH (ref 0.0–0.2)
Indirect Bilirubin: 1.8 mg/dL — ABNORMAL HIGH (ref 0.3–0.9)
Total Bilirubin: 2.1 mg/dL — ABNORMAL HIGH (ref 0.3–1.2)
Total Protein: 7.8 g/dL (ref 6.5–8.1)

## 2018-11-24 LAB — CBC
HCT: 45.5 % (ref 39.0–52.0)
Hemoglobin: 15.3 g/dL (ref 13.0–17.0)
MCH: 28.8 pg (ref 26.0–34.0)
MCHC: 33.6 g/dL (ref 30.0–36.0)
MCV: 85.7 fL (ref 80.0–100.0)
Platelets: 198 10*3/uL (ref 150–400)
RBC: 5.31 MIL/uL (ref 4.22–5.81)
RDW: 13.2 % (ref 11.5–15.5)
WBC: 14.1 10*3/uL — ABNORMAL HIGH (ref 4.0–10.5)
nRBC: 0 % (ref 0.0–0.2)

## 2018-11-24 LAB — TROPONIN I (HIGH SENSITIVITY)
Troponin I (High Sensitivity): 3 ng/L (ref <18)
Troponin I (High Sensitivity): 3 ng/L (ref <18)

## 2018-11-24 LAB — LACTATE DEHYDROGENASE: LDH: 138 U/L (ref 98–192)

## 2018-11-24 MED ORDER — SODIUM CHLORIDE 0.9 % IV BOLUS
1000.0000 mL | Freq: Once | INTRAVENOUS | Status: AC
  Administered 2018-11-24: 18:00:00 1000 mL via INTRAVENOUS

## 2018-11-24 MED ORDER — ONDANSETRON 8 MG PO TBDP: 8.0000 mg | ORAL_TABLET | Freq: Three times a day (TID) | ORAL | 0 refills | Status: DC | PRN

## 2018-11-24 MED ORDER — OXYCODONE-ACETAMINOPHEN 5-325 MG PO TABS: 1.0000 | ORAL_TABLET | Freq: Four times a day (QID) | ORAL | 0 refills | Status: AC | PRN

## 2018-11-24 MED ORDER — SODIUM CHLORIDE 0.9% FLUSH
3.0000 mL | Freq: Once | INTRAVENOUS | Status: AC
  Administered 2018-11-24: 3 mL via INTRAVENOUS

## 2018-11-24 NOTE — Discharge Instructions (Signed)
You should primarily drink clear liquids over the next 1 to 2 days and then gradually advance your diet, eating small amounts of food throughout the day rather than any large meals.  Avoid alcohol or spicy or fatty foods.  You can take the Percocet as needed for pain and the Zofran for nausea.  Return to the ER for new, worsening, or persistent severe pain, vomiting, fever, weakness, or any other new or worsening symptoms that concern you.

## 2018-11-24 NOTE — ED Provider Notes (Signed)
[facility] Emergency Department Provider Note ____________________________________________   First MD Initiated Contact with Patient 11/24/18 1753     (approximate)  I have reviewed the triage vital signs and the nursing notes.   HISTORY  Chief Complaint Chest Pain    HPI Nicholas Hobbs is a 48 y.o. male with PMH as noted below including history of pancreatitis (which the patient states is due to unknown etiology) presents with epigastric abdominal and some chest pain over the last several days, gradual onset, intermittent course, and radiating to his back.  It is associated with intermittent nausea but no vomiting.  He has had a few episodes where he felt short of breath and sweaty although he does not feel this way currently.  He denies any cough or fever.  He states it does feel similar to prior episodes of pancreatitis.  He denies daily alcohol use although he states he did possibly have 6 or 8 beers 5 days ago.  Past Medical History:  Diagnosis Date  . GERD (gastroesophageal reflux disease)   . Pancreatitis   . Rib fracture   . Shoulder fracture, right   . Sleep apnea     Patient Active Problem List   Diagnosis Date Noted  . 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 Fracture of scapula 07/21/2017  . Erectile dysfunction 07/15/2015  . Current tear knee, medial meniscus 05/08/2014  . Arthritis of knee, degenerative 05/08/2014  . Pancreatitis, recurrent 02/01/2012  . PANCREATITIS 04/30/2008  . SLEEP APNEA 04/30/2008    Past Surgical History:  Procedure Laterality Date  . APPENDECTOMY  2007  . ESOPHAGOGASTRODUODENOSCOPY (EGD) WITH PROPOFOL N/A 08/28/2016   Procedure: ESOPHAGOGASTRODUODENOSCOPY (EGD) WITH PROPOFOL WITH DILATION;  Surgeon: Jonathon Bellows, MD;  Location: [facility] ENDOSCOPY;  Service: Endoscopy;  Laterality: N/A;  . KNEE ARTHROPLASTY Left 03/23/2018   Procedure: COMPUTER ASSISTED TOTAL KNEE ARTHROPLASTY;  Surgeon: Dereck Leep, MD;   Location: [facility] ORS;  Service: Orthopedics;  Laterality: Left;  . KNEE SURGERY Right 06/2014   meniscus repair    Prior to Admission medications   Medication Sig Start Date End Date Taking? Authorizing Provider  Ascorbic Acid (VITAMIN C) 1000 MG tablet Take 1,000 mg by mouth daily.     [provider]  cetirizine (ZYRTEC) 10 MG tablet Take 10 mg by mouth daily as needed for allergies.    [provider]  doxycycline (VIBRA-TABS) 100 MG tablet Take 1 tablet (100 mg total) by mouth 2 (two) times daily. 07/21/18   Hyatt, Max T, DPM  enoxaparin (LOVENOX) 40 MG/0.4ML injection Inject 0.4 mLs (40 mg total) into the skin daily for 14 days. 03/26/18 04/09/18  Watt Climes, PA  Multiple Vitamins-Minerals (MULTIVITAMIN WITH MINERALS) tablet Take 1 tablet by mouth daily.    [provider]  neomycin-polymyxin-hydrocortisone (CORTISPORIN) OTIC solution Apply one to two drops to toe after soaking twice daily. 07/21/18   Hyatt, Max T, DPM  Omeprazole 20 MG TBEC Take 20 mg by mouth daily.     [provider]  ondansetron (ZOFRAN ODT) 8 MG disintegrating tablet Take 1 tablet (8 mg total) by mouth every 8 (eight) hours as needed for nausea or vomiting. 11/24/18   Arta Silence, MD  oxyCODONE (OXY IR/ROXICODONE) 5 MG immediate release tablet Take 1-2 tablets (5-10 mg total) by mouth every 6 (six) hours as needed for moderate pain (pain score 4-6). 03/24/18   Watt Climes, PA  oxyCODONE-acetaminophen (PERCOCET) 5-325 MG tablet Take 1 tablet by  mouth every 6 (six) hours as needed for up to 5 days for severe pain. 11/24/18 11/29/18  Arta Silence, MD  sildenafil (REVATIO) 20 MG tablet TAKE 2-5 TABLETS BY MOUTH AS NEEDED FOR SEXUAL ACTIVITY 02/16/17   Carmon Ginsberg, PA  sucralfate (CARAFATE) 1 g tablet Take 1 tablet (1 g total) by mouth 2 (two) times daily for 15 days. 03/20/18 04/04/18  Arta Silence, MD  traMADol (ULTRAM) 50 MG tablet Take 1-2 tablets (50-100 mg total) by  mouth every 4 (four) hours as needed for moderate pain. 03/24/18   Watt Climes, PA    Allergies Biaxin [clarithromycin] and Cat hair extract  Family History  Problem Relation Age of Onset  . Seizures Father     Social History Social History   Tobacco Use  . Smoking status: Never Smoker  . Smokeless tobacco: Former Systems developer    Types: Chew  Substance Use Topics  . Alcohol use: Yes    Alcohol/week: 0.0 standard drinks  . Drug use: Never    Review of Systems  Constitutional: No fever. Eyes: No redness. ENT: No sore throat. Cardiovascular: Positive for chest pain. Respiratory: Denies shortness of breath. Gastrointestinal: Positive for nausea.  Genitourinary: Negative for flank pain.  Musculoskeletal: Positive for back pain. Skin: Negative for rash. Neurological: Negative for headache.   ____________________________________________   PHYSICAL EXAM:  VITAL SIGNS: ED Triage Vitals  Enc Vitals Group     BP 11/24/18 1738 113/70     Pulse Rate 11/24/18 1738 66     Resp 11/24/18 1738 20     Temp 11/24/18 1738 99.5 F (37.5 C)     Temp Source 11/24/18 1738 Oral     SpO2 11/24/18 1738 94 %     Weight --      Height --      Head Circumference --      Peak Flow --      Pain Score 11/24/18 1735 10     Pain Loc --      Pain Edu? --      Excl. in [place]? --     Constitutional: Alert and oriented.  Relatively well appearing and in no acute distress. Eyes: Conjunctivae are normal.  No scleral icterus. Head: Atraumatic. Nose: No congestion/rhinnorhea. Mouth/Throat: Mucous membranes are moist.   Neck: Normal range of motion.  Cardiovascular: Normal rate, regular rhythm. Good peripheral circulation. Respiratory: Normal respiratory effort.  No retractions.  Gastrointestinal: Soft with moderate epigastric tenderness.  No distention.  Genitourinary: No flank tenderness. Musculoskeletal: Extremities warm and well perfused.  Neurologic:  Normal speech and language. No gross  focal neurologic deficits are appreciated.  Skin:  Skin is warm and dry. No rash noted. Psychiatric: Mood and affect are normal. Speech and behavior are normal.  ____________________________________________   LABS (all labs ordered are listed, but only abnormal results are displayed)  Labs Reviewed  BASIC METABOLIC PANEL - Abnormal; Notable for the following components:      Result Value   Glucose, Bld 114 (*)    All other components within normal limits  CBC - Abnormal; Notable for the following components:   WBC 14.1 (*)    All other components within normal limits  HEPATIC FUNCTION PANEL - Abnormal; Notable for the following components:   Total Bilirubin 2.1 (*)    Bilirubin, Direct 0.3 (*)    Indirect Bilirubin 1.8 (*)    All other components within normal limits  LIPASE, BLOOD - Abnormal; Notable for the following components:  Lipase 54 (*)    All other components within normal limits  LACTATE DEHYDROGENASE  TROPONIN I (HIGH SENSITIVITY)  TROPONIN I (HIGH SENSITIVITY)   ____________________________________________  EKG  ED ECG REPORT I, Arta Silence, the attending physician, personally viewed and interpreted this ECG.  Date: 11/24/2018 EKG Time: 1740 Rate: 96 Rhythm: normal sinus rhythm QRS Axis: normal Intervals: normal ST/T Wave abnormalities: normal Narrative Interpretation: no evidence of acute ischemia  ____________________________________________  RADIOLOGY  CXR: No acute abnormality  ____________________________________________   PROCEDURES  Procedure(s) performed: No  Procedures  Critical Care performed: No ____________________________________________   INITIAL IMPRESSION / ASSESSMENT AND PLAN / ED COURSE  Pertinent labs & imaging results that were available during my care of the patient were reviewed by me and considered in my medical decision making (see chart for details).  48 year old male with PMH as noted above including a  prior history of pancreatitis presents with epigastric/lower chest area pain over the last few days with intermittent nausea but no vomiting.  He does report that the pain radiates to his back.  He states he had 6 or 8 beers 5 days ago but otherwise does not drink alcohol heavily.  He has had pancreatitis in the past and states that the current symptoms feel similar.  On exam he is overall relatively well-appearing.  His vitals are normal except for low-grade temperature.  The abdomen is soft with some epigastric tenderness.  Overall presentation is most consistent with pancreatitis.  I have a low suspicion for ACS and the EKG is normal.  We will obtain a troponin to further evaluate.  Differential would also include gastritis, PUD, or less likely hepatobiliary cause.  We will obtain chest x-ray, lab work-up, and reassess.  ----------------------------------------- 8:23 PM on 11/24/2018 -----------------------------------------  Lab work-up reveals very minimally elevated lipase and slightly elevated bilirubins although it appears that the patient's bilirubins run slightly high at baseline.  The WBC count is also somewhat elevated.  However, the patient appears very comfortable and has not required any pain medication in the ED.  He has been tolerating p.o. and has had no vomiting.   At this time, the presentation is consistent with mild pancreatitis.  I suspect that it was exacerbated by his alcohol use over the weekend.  At this time, there is no evidence of biliary obstruction or other acute surgical cause.  He has no history of gallstones and has had negative ultrasounds at several times in the past.  There is no evidence of ACS.  The patient is stable for discharge home.  The patient states he feels well and would prefer to go home.  I counseled him on the results of the work-up.  I gave him instructions as far as staying on clear liquids for a few days and then gradually advancing his diet as  well as avoidance of alcohol and foods that could exacerbate the pancreatitis.  I gave him thorough return precautions and he expressed understanding.    ____________________________________________   FINAL CLINICAL IMPRESSION(S) / ED DIAGNOSES  Final diagnoses:  Acute pancreatitis, unspecified complication status, unspecified pancreatitis type      NEW MEDICATIONS STARTED DURING THIS VISIT:  New Prescriptions   ONDANSETRON (ZOFRAN ODT) 8 MG DISINTEGRATING TABLET    Take 1 tablet (8 mg total) by mouth every 8 (eight) hours as needed for nausea or vomiting.   OXYCODONE-ACETAMINOPHEN (PERCOCET) 5-325 MG TABLET    Take 1 tablet by mouth every 6 (six) hours as needed for up  to 5 days for severe pain.     Note:  This document was prepared using Dragon voice recognition software and may include unintentional dictation errors.    Arta Silence, MD 11/24/18 2025

## 2018-11-24 NOTE — ED Triage Notes (Signed)
Pt to triage via wheelchair.  Pt has chest pain in center of chest.  Pt diaphoretic. Pt states sob.  No cough  Non smoker.   Hx pancreatitis.   Pt alert.

## 2020-07-26 ENCOUNTER — Other Ambulatory Visit: Payer: Self-pay

## 2020-07-26 ENCOUNTER — Encounter: Payer: Self-pay | Admitting: Family Medicine

## 2020-07-26 ENCOUNTER — Ambulatory Visit (INDEPENDENT_AMBULATORY_CARE_PROVIDER_SITE_OTHER): Payer: 59 | Admitting: Family Medicine

## 2020-07-26 VITALS — BP 122/78 | HR 71 | Temp 98.0°F | Resp 16 | Ht 76.0 in | Wt 293.8 lb

## 2020-07-26 DIAGNOSIS — N529 Male erectile dysfunction, unspecified: Secondary | ICD-10-CM | POA: Diagnosis not present

## 2020-07-26 DIAGNOSIS — Z125 Encounter for screening for malignant neoplasm of prostate: Secondary | ICD-10-CM | POA: Diagnosis not present

## 2020-07-26 DIAGNOSIS — Z136 Encounter for screening for cardiovascular disorders: Secondary | ICD-10-CM | POA: Diagnosis not present

## 2020-07-26 DIAGNOSIS — Z1211 Encounter for screening for malignant neoplasm of colon: Secondary | ICD-10-CM

## 2020-07-26 MED ORDER — SILDENAFIL CITRATE 50 MG PO TABS: 50.0000 mg | ORAL_TABLET | Freq: Every day | ORAL | 3 refills | Status: DC | PRN

## 2020-07-26 MED ORDER — PAROXETINE HCL 20 MG PO TABS: 10.0000 mg | ORAL_TABLET | Freq: Every day | ORAL | 1 refills | Status: DC

## 2020-07-26 NOTE — Patient Instructions (Addendum)
Call [facility] at [phone] if you don't get a call from them within 1-2 weeks  Use the GoodRx App on your smart phone to find the best out of pocket prices for prescriptions

## 2020-07-26 NOTE — Progress Notes (Signed)
Established patient visit   Patient: Nicholas Hobbs   DOB: [birth year]   50 y.o. Male  MRN: [id number] Visit Date: 07/26/2020  Today's healthcare provider: Lelon Huh, MD   Chief Complaint  Patient presents with  . Referral  . Erectile Dysfunction   Subjective    HPI  Patient here today requesting referral to Gastrointerologist for colonoscopy. Has no personal or family history of colon polyps or cancer.   He also reports long history of ED. Has previously been prescribed sildenafil 20mg  which helped with ED, but also has problem with early ejaculation which sildenafil did not help with.     Medications: Outpatient Medications Prior to Visit  Medication Sig  . Ascorbic Acid (VITAMIN C) 1000 MG tablet Take 1,000 mg by mouth daily.   . cetirizine (ZYRTEC) 10 MG tablet Take 10 mg by mouth daily as needed for allergies.  . Multiple Vitamins-Minerals (MULTIVITAMIN WITH MINERALS) tablet Take 1 tablet by mouth daily.  . Omeprazole 20 MG TBEC Take 20 mg by mouth daily.   . [DISCONTINUED] doxycycline (VIBRA-TABS) 100 MG tablet Take 1 tablet (100 mg total) by mouth 2 (two) times daily.  . [DISCONTINUED] enoxaparin (LOVENOX) 40 MG/0.4ML injection Inject 0.4 mLs (40 mg total) into the skin daily for 14 days.  . [DISCONTINUED] neomycin-polymyxin-hydrocortisone (CORTISPORIN) OTIC solution Apply one to two drops to toe after soaking twice daily.  . [DISCONTINUED] ondansetron (ZOFRAN ODT) 8 MG disintegrating tablet Take 1 tablet (8 mg total) by mouth every 8 (eight) hours as needed for nausea or vomiting.  . [DISCONTINUED] oxyCODONE (OXY IR/ROXICODONE) 5 MG immediate release tablet Take 1-2 tablets (5-10 mg total) by mouth every 6 (six) hours as needed for moderate pain (pain score 4-6).  . [DISCONTINUED] sildenafil (REVATIO) 20 MG tablet TAKE 2-5 TABLETS BY MOUTH AS NEEDED FOR SEXUAL ACTIVITY  . [DISCONTINUED] sucralfate (CARAFATE) 1 g tablet Take 1 tablet (1 g total) by mouth 2 (two)  times daily for 15 days.  . [DISCONTINUED] traMADol (ULTRAM) 50 MG tablet Take 1-2 tablets (50-100 mg total) by mouth every 4 (four) hours as needed for moderate pain.   No facility-administered medications prior to visit.    Review of Systems  Constitutional: Negative for activity change, appetite change and fatigue.  Respiratory: Negative for chest tightness and shortness of breath.   Cardiovascular: Negative for chest pain and palpitations.         Objective    BP 122/78 (BP Location: Right Arm, Patient Position: Sitting, Cuff Size: Large)   Pulse 71   Temp 98 F (36.7 C) (Temporal)   Resp 16   Ht 6\' 4"  (1.93 m)   Wt 293 lb 12.8 oz (133.3 kg)   SpO2 97%   BMI 35.76 kg/m    Physical Exam  General appearance: Mildly obese male, cooperative and in no acute distress Head: Normocephalic, without obvious abnormality, atraumatic Respiratory: Respirations even and unlabored, normal respiratory rate Extremities: All extremities are intact.  Skin: Skin color, texture, turgor normal. No rashes seen  Psych: Appropriate mood and affect. Neurologic: Mental status: Alert, oriented to person, place, and time, thought content appropriate.     Assessment & Plan     1. Erectile dysfunction, unspecified erectile dysfunction type - Testosterone,Free and Total - CBC - Comprehensive metabolic panel - TSH  Sildenafil has helped in the past, but still had issues with early ejaculation. Will try  PARoxetine (PAXIL) 20 MG tablet; Take 0.5-1 tablets (10-20 mg total) by mouth  daily and sildenafil (VIAGRA) 50 MG tablet; Take 1 tablet (50 mg total) by mouth daily as needed  Consider urology if above meds not helpful.   2. Colon cancer screening  - Ambulatory referral to gastroenterology for colonoscopy  3. Prostate cancer screening  - PSA  4. Encounter for special screening examination for cardiovascular disorder  - Lipid panel       The entirety of the information documented in  the History of Present Illness, Review of Systems and Physical Exam were personally obtained by me. Portions of this information were initially documented by the CMA and reviewed by me for thoroughness and accuracy.      Lelon Huh, MD  [facility] [phone] (phone) [phone] (fax)  [place]

## 2020-07-31 ENCOUNTER — Telehealth: Payer: Self-pay | Admitting: Family Medicine

## 2020-07-31 LAB — COMPREHENSIVE METABOLIC PANEL
ALT: 20 IU/L (ref 0–44)
AST: 23 IU/L (ref 0–40)
Albumin/Globulin Ratio: 1.6 (ref 1.2–2.2)
Albumin: 4.6 g/dL (ref 4.0–5.0)
Alkaline Phosphatase: 78 IU/L (ref 44–121)
BUN/Creatinine Ratio: 14 (ref 9–20)
BUN: 14 mg/dL (ref 6–24)
Bilirubin Total: 0.8 mg/dL (ref 0.0–1.2)
CO2: 21 mmol/L (ref 20–29)
Calcium: 9.4 mg/dL (ref 8.7–10.2)
Chloride: 103 mmol/L (ref 96–106)
Creatinine, Ser: 1.02 mg/dL (ref 0.76–1.27)
Globulin, Total: 2.8 g/dL (ref 1.5–4.5)
Glucose: 100 mg/dL — ABNORMAL HIGH (ref 65–99)
Potassium: 4.2 mmol/L (ref 3.5–5.2)
Sodium: 140 mmol/L (ref 134–144)
Total Protein: 7.4 g/dL (ref 6.0–8.5)
eGFR: 90 mL/min/{1.73_m2} (ref >59)

## 2020-07-31 LAB — CBC
Hematocrit: 47.5 % (ref 37.5–51.0)
Hemoglobin: 16.1 g/dL (ref 13.0–17.7)
MCH: 29 pg (ref 26.6–33.0)
MCHC: 33.9 g/dL (ref 31.5–35.7)
MCV: 85 fL (ref 79–97)
Platelets: 188 10*3/uL (ref 150–450)
RBC: 5.56 x10E6/uL (ref 4.14–5.80)
RDW: 12.9 % (ref 11.6–15.4)
WBC: 8.3 10*3/uL (ref 3.4–10.8)

## 2020-07-31 LAB — LIPID PANEL
Chol/HDL Ratio: 5.3 ratio — ABNORMAL HIGH (ref 0.0–5.0)
Cholesterol, Total: 224 mg/dL — ABNORMAL HIGH (ref 100–199)
HDL: 42 mg/dL (ref >39)
LDL Chol Calc (NIH): 143 mg/dL — ABNORMAL HIGH (ref 0–99)
Triglycerides: 213 mg/dL — ABNORMAL HIGH (ref 0–149)
VLDL Cholesterol Cal: 39 mg/dL (ref 5–40)

## 2020-07-31 LAB — TESTOSTERONE,FREE AND TOTAL
Testosterone, Free: 7 pg/mL — ABNORMAL LOW (ref 7.2–24.0)
Testosterone: 373 ng/dL (ref 264–916)

## 2020-07-31 LAB — PSA: Prostate Specific Ag, Serum: 0.3 ng/mL (ref 0.0–4.0)

## 2020-07-31 LAB — TSH: TSH: 1.27 u[IU]/mL (ref 0.450–4.500)

## 2020-07-31 NOTE — Telephone Encounter (Signed)
Pt was given written script this week for  sildenafil (VIAGRA) 50 MG tablet 30 tablet 3 07/26/2020    Sig - Route: Take 1 tablet (50 mg total) by mouth daily as needed for erectile dysfunction. - Oral   Class: Print    Pt states that the cost was not affordable. Pt is requesting still sildenafil (VIAGRA but at a 20 mg dose.  PT request that this also be in written from and just to call him at [phone] when ready for pu as he must compare prices.

## 2020-08-02 MED ORDER — SILDENAFIL CITRATE 20 MG PO TABS: ORAL_TABLET | ORAL | 5 refills | Status: DC

## 2020-08-06 NOTE — Telephone Encounter (Signed)
Pt returned call and plans to pick up printed copy of refill tomorrow. 20 Mg sildenafil specifically

## 2020-08-10 IMAGING — CR DG CHEST 2V
2 series · 2 of 2 positions shown · non-contrast
Comparison: 12/02/2017

CLINICAL DATA: Chest and epigastric pain beginning yesterday.
Personal history of pancreatitis.

EXAM:
CHEST - 2 VIEW

[chest pa]
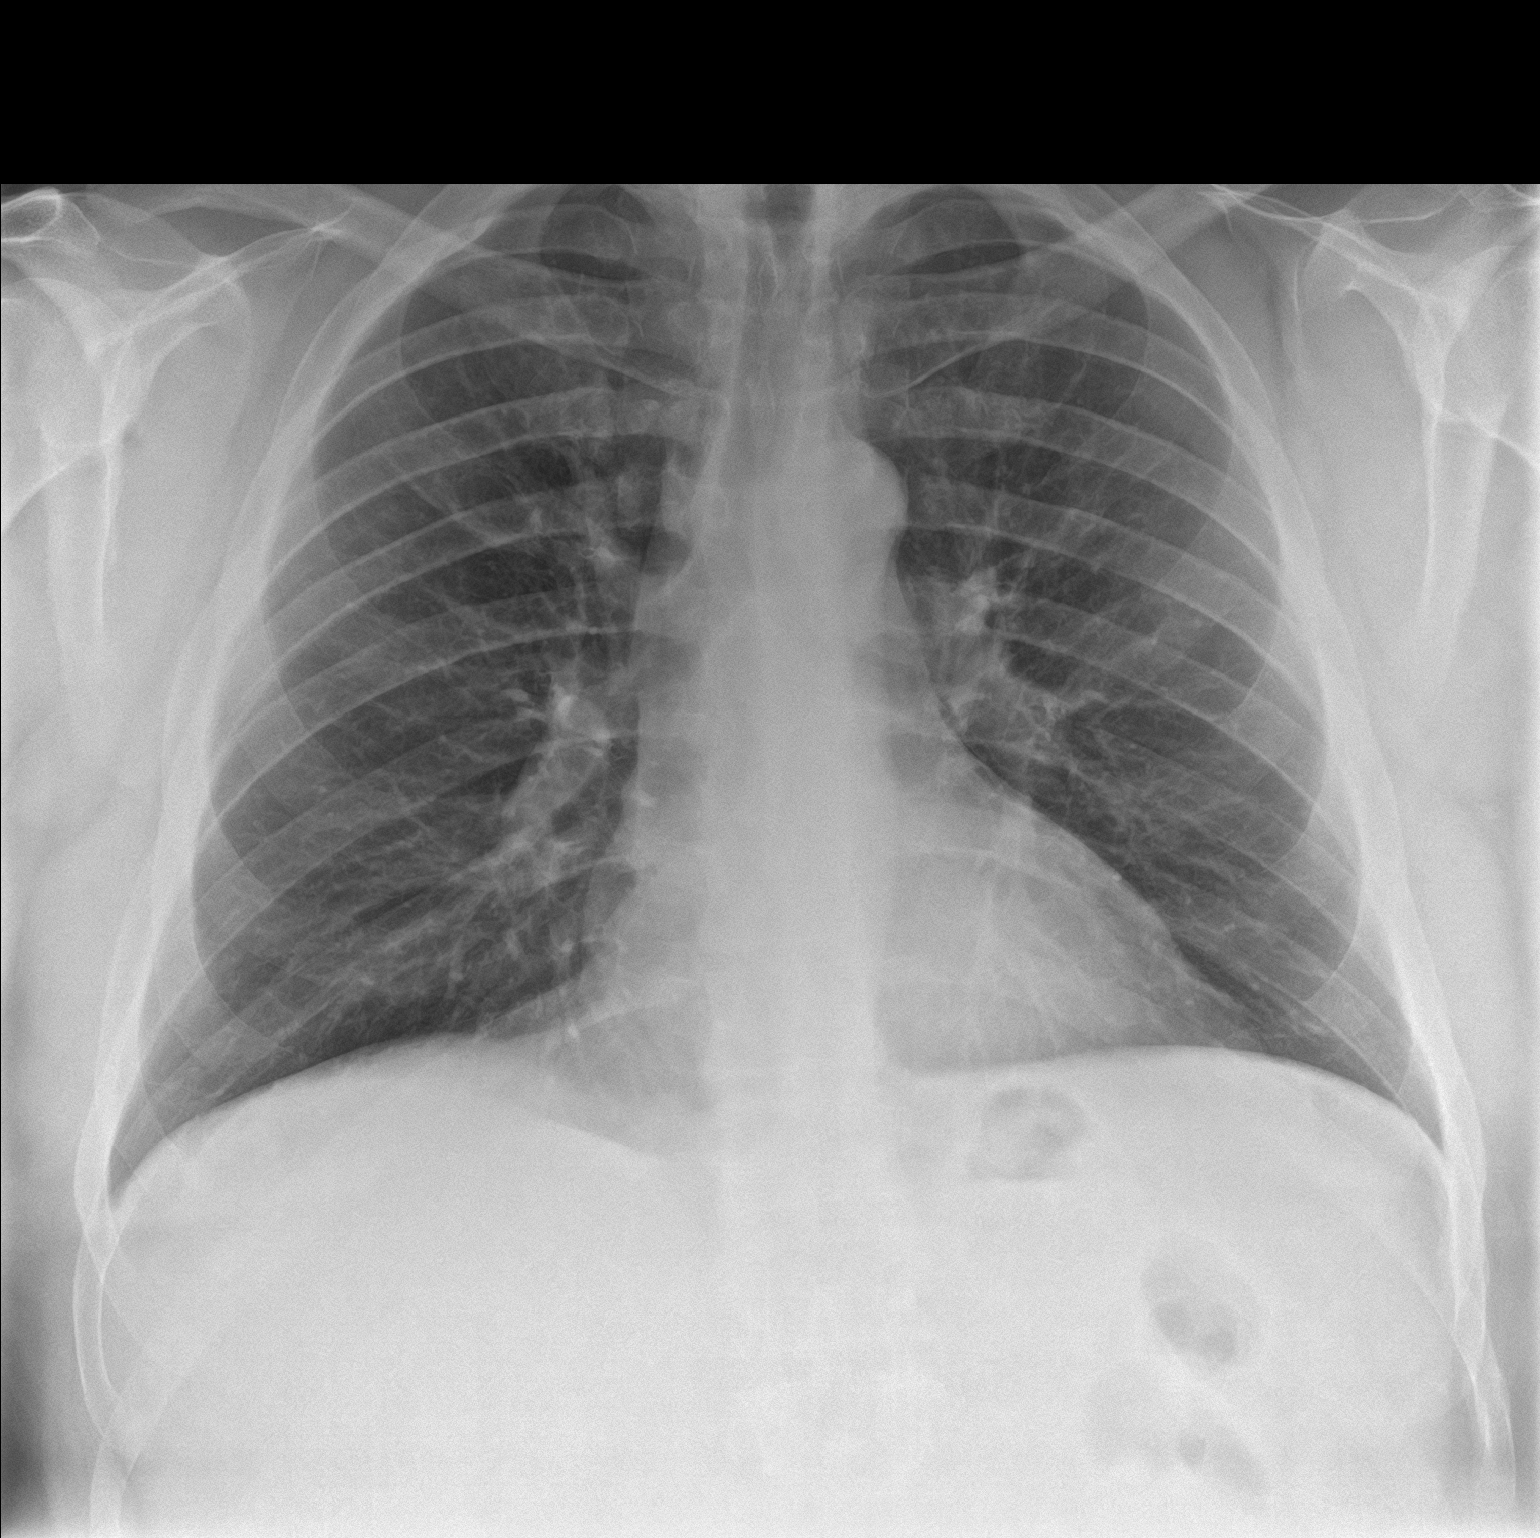

[chest lat]
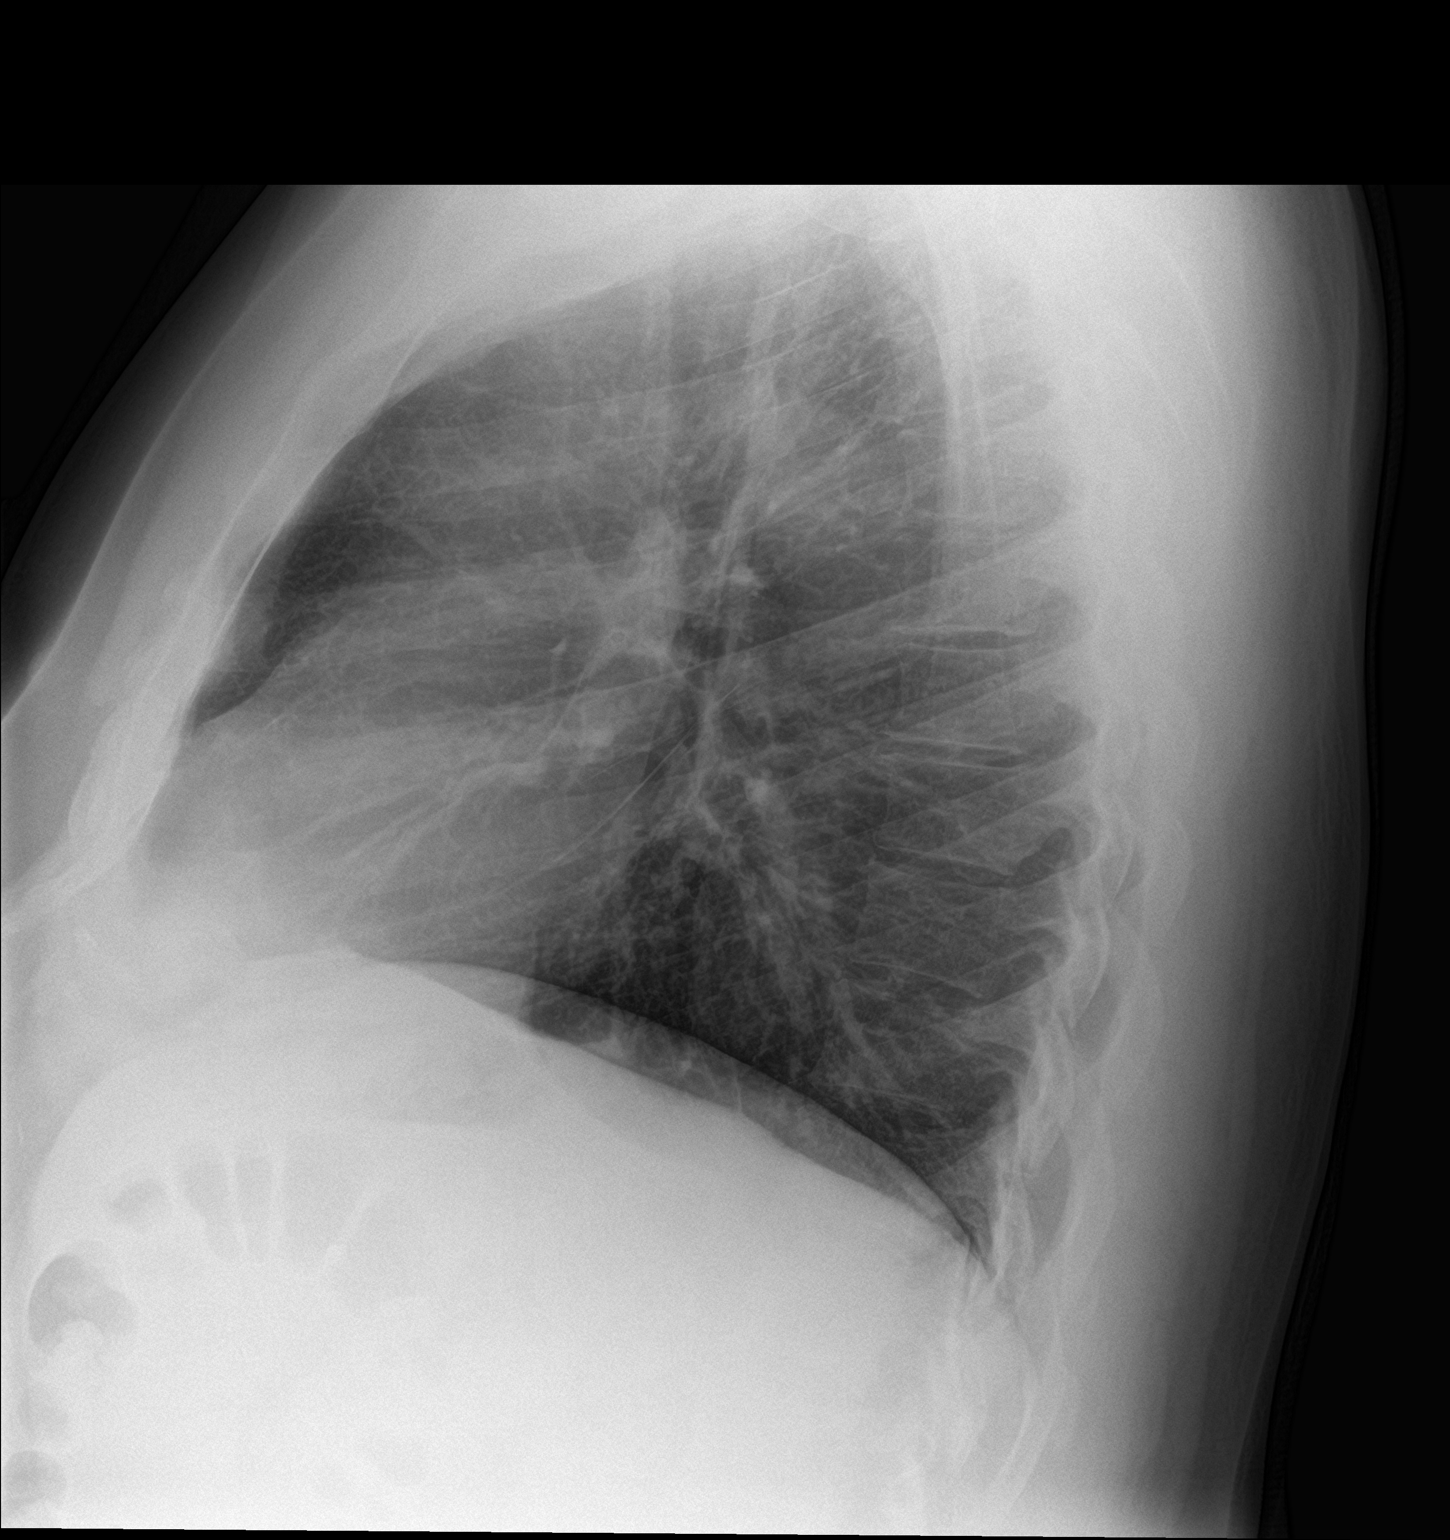

[2 of 2 positions shown; findings below may reference images not displayed]

FINDINGS: The heart size and mediastinal contours are within normal limits.
Both lungs are clear. The visualized skeletal structures are
unremarkable.
IMPRESSION: No active cardiopulmonary disease.

## 2020-08-14 ENCOUNTER — Other Ambulatory Visit: Payer: Self-pay

## 2020-08-14 ENCOUNTER — Telehealth (INDEPENDENT_AMBULATORY_CARE_PROVIDER_SITE_OTHER): Payer: Self-pay | Admitting: Gastroenterology

## 2020-08-14 DIAGNOSIS — Z1211 Encounter for screening for malignant neoplasm of colon: Secondary | ICD-10-CM

## 2020-08-14 MED ORDER — NA SULFATE-K SULFATE-MG SULF 17.5-3.13-1.6 GM/177ML PO SOLN: 1.0000 | Freq: Once | ORAL | 0 refills | Status: AC

## 2020-08-14 NOTE — Progress Notes (Signed)
Gastroenterology Pre-Procedure Review  Request Date: Friday 09/27/20 Requesting Physician: Dr. Bonna Gains  PATIENT REVIEW QUESTIONS: The patient responded to the following health history questions as indicated:    1. Are you having any GI issues? no 2. Do you have a personal history of Polyps? no 3. Do you have a family history of Colon Cancer or Polyps? no 4. Diabetes Mellitus? no 5. Joint replacements in the past 12 months?no 6. Major health problems in the past 3 months?no 7. Any artificial heart valves, MVP, or defibrillator?no    MEDICATIONS & ALLERGIES:    Patient reports the following regarding taking any anticoagulation/antiplatelet therapy:   Plavix, Coumadin, Eliquis, Xarelto, Lovenox, Pradaxa, Brilinta, or Effient? no Aspirin? no  Patient confirms/reports the following medications:  Current Outpatient Medications  Medication Sig Dispense Refill  . Ascorbic Acid (VITAMIN C) 1000 MG tablet Take 1,000 mg by mouth daily.     . cetirizine (ZYRTEC) 10 MG tablet Take 10 mg by mouth daily as needed for allergies.    . Multiple Vitamins-Minerals (MULTIVITAMIN WITH MINERALS) tablet Take 1 tablet by mouth daily.    . Omeprazole 20 MG TBEC Take 20 mg by mouth daily.     Marland Kitchen PARoxetine (PAXIL) 20 MG tablet Take 0.5-1 tablets (10-20 mg total) by mouth daily. 30 tablet 1  . sildenafil (REVATIO) 20 MG tablet TAKE 2-5 TABLETS BY MOUTH ONCE DAILY AS NEEDED FOR ERECTILE DYSFUNCTION 50 tablet 5  . sildenafil (VIAGRA) 50 MG tablet Take 1 tablet (50 mg total) by mouth daily as needed for erectile dysfunction. 30 tablet 3   No current facility-administered medications for this visit.    Patient confirms/reports the following allergies:  Allergies  Allergen Reactions  . Biaxin [Clarithromycin] Itching    REACTION: rash  . Cat Hair Extract Other (See Comments)    Watery eyes, Runny Nose, and Itching   . Other Itching    Watery eyes, runny nose    No orders of the defined types were placed  in this encounter.   AUTHORIZATION INFORMATION Primary Insurance: 1D#: Group #:  Secondary Insurance: 1D#: Group #:  SCHEDULE INFORMATION: Date: 06/17/22Time: Location:[facility]

## 2020-09-03 ENCOUNTER — Other Ambulatory Visit: Payer: Self-pay

## 2020-09-03 ENCOUNTER — Encounter: Payer: Self-pay | Admitting: Family Medicine

## 2020-09-03 ENCOUNTER — Ambulatory Visit (INDEPENDENT_AMBULATORY_CARE_PROVIDER_SITE_OTHER): Payer: 59 | Admitting: Family Medicine

## 2020-09-03 VITALS — BP 116/79 | HR 69 | Resp 16 | Ht 76.0 in | Wt 302.2 lb

## 2020-09-03 DIAGNOSIS — R198 Other specified symptoms and signs involving the digestive system and abdomen: Secondary | ICD-10-CM | POA: Diagnosis not present

## 2020-09-03 DIAGNOSIS — Z23 Encounter for immunization: Secondary | ICD-10-CM

## 2020-09-03 DIAGNOSIS — K219 Gastro-esophageal reflux disease without esophagitis: Secondary | ICD-10-CM

## 2020-09-03 DIAGNOSIS — E669 Obesity, unspecified: Secondary | ICD-10-CM

## 2020-09-03 DIAGNOSIS — R0989 Other specified symptoms and signs involving the circulatory and respiratory systems: Secondary | ICD-10-CM

## 2020-09-03 DIAGNOSIS — Z0000 Encounter for general adult medical examination without abnormal findings: Secondary | ICD-10-CM | POA: Diagnosis not present

## 2020-09-03 DIAGNOSIS — Z6836 Body mass index (BMI) 36.0-36.9, adult: Secondary | ICD-10-CM

## 2020-09-03 MED ORDER — RABEPRAZOLE SODIUM 20 MG PO TBEC: 20.0000 mg | DELAYED_RELEASE_TABLET | Freq: Every day | ORAL | 2 refills | Status: DC

## 2020-09-03 NOTE — Progress Notes (Signed)
Complete physical exam   Patient: Nicholas Hobbs   DOB: 1971/03/16   50 y.o. Male  MRN: [id number] Visit Date: 09/03/2020  Today's healthcare provider: Lelon Huh, MD   Chief Complaint  Patient presents with  . Annual Exam   Subjective     HPI  Nicholas Hobbs is a 50 y.o. male who presents today for a complete physical exam.  He reports consuming a general diet. The patient has a physically strenuous job, but has no regular exercise apart from work.  He generally feels well. He reports sleeping well. He does have additional problems to discuss today. Patient reports that he notices in the morning or after eating that he feels the need to cleat his throat, patient reports that he does have issues at time swallowing and states that he takes otc Prilosec for reflux but has no relief anymore continuing on medication. His main complaint is sensation in his throat when he gets up in the morning and after eating. He does have history of esophagitis on endoscopy in 2018 and 2010. Denies burning or pain his throat or chest.   Past Medical History:  Diagnosis Date  . GERD (gastroesophageal reflux disease)   . Pancreatitis   . Rib fracture   . Shoulder fracture, right   . Sleep apnea    Past Surgical History:  Procedure Laterality Date  . APPENDECTOMY  2007  . ESOPHAGOGASTRODUODENOSCOPY (EGD) WITH PROPOFOL N/A 08/28/2016   Procedure: ESOPHAGOGASTRODUODENOSCOPY (EGD) WITH PROPOFOL WITH DILATION;  Surgeon: Jonathon Bellows, MD;  Location: [facility] ENDOSCOPY;  Service: Endoscopy;  Laterality: N/A;  . KNEE ARTHROPLASTY Left 03/23/2018   Procedure: COMPUTER ASSISTED TOTAL KNEE ARTHROPLASTY;  Surgeon: Dereck Leep, MD;  Location: [facility] ORS;  Service: Orthopedics;  Laterality: Left;  . KNEE SURGERY Right 06/2014   meniscus repair   Social History   Socioeconomic History  . Marital status: Divorced    Spouse name: Not on file  . Number of children: Not on file  . Years of education: Not on  file  . Highest education level: Not on file  Occupational History  . Not on file  Tobacco Use  . Smoking status: Never Smoker  . Smokeless tobacco: Former Systems developer    Types: Secondary school teacher  . Vaping Use: Never used  Substance and Sexual Activity  . Alcohol use: Yes    Alcohol/week: 0.0 standard drinks  . Drug use: Never  . Sexual activity: Not on file  Other Topics Concern  . Not on file  Social History Narrative  . Not on file   Social Determinants of Health   Financial Resource Strain: Not on file  Food Insecurity: Not on file  Transportation Needs: Not on file  Physical Activity: Not on file  Stress: Not on file  Social Connections: Not on file  Intimate Partner Violence: Not on file   Family Status  Relation Name Status  . Mother  Alive  . Father  Deceased   Family History  Problem Relation Age of Onset  . Seizures Father    Allergies  Allergen Reactions  . Biaxin [Clarithromycin] Itching    REACTION: rash  . Cat Hair Extract Other (See Comments)    Watery eyes, Runny Nose, and Itching   . Other Itching    Watery eyes, runny nose    Patient Care Team: Birdie Sons, MD as PCP - General (Family Medicine)   Medications: Outpatient Medications Prior to Visit  Medication  Sig  . Ascorbic Acid (VITAMIN C) 1000 MG tablet Take 1,000 mg by mouth daily.   . cetirizine (ZYRTEC) 10 MG tablet Take 10 mg by mouth daily as needed for allergies.  . Multiple Vitamins-Minerals (MULTIVITAMIN WITH MINERALS) tablet Take 1 tablet by mouth daily.  . Omeprazole 20 MG TBEC Take 20 mg by mouth daily.   Marland Kitchen PARoxetine (PAXIL) 20 MG tablet Take 0.5-1 tablets (10-20 mg total) by mouth daily.  . sildenafil (REVATIO) 20 MG tablet TAKE 2-5 TABLETS BY MOUTH ONCE DAILY AS NEEDED FOR ERECTILE DYSFUNCTION  . sildenafil (VIAGRA) 50 MG tablet Take 1 tablet (50 mg total) by mouth daily as needed for erectile dysfunction.   No facility-administered medications prior to visit.    Review  of Systems  All other systems reviewed and are negative.   Last CBC Lab Results  Component Value Date   WBC 8.3 07/26/2020   HGB 16.1 07/26/2020   HCT 47.5 07/26/2020   MCV 85 07/26/2020   MCH 29.0 07/26/2020   RDW 12.9 07/26/2020   PLT 188 71/09/2692   Last metabolic panel Lab Results  Component Value Date   GLUCOSE 100 (H) 07/26/2020   NA 140 07/26/2020   K 4.2 07/26/2020   CL 103 07/26/2020   CO2 21 07/26/2020   BUN 14 07/26/2020   CREATININE 1.02 07/26/2020   GFRNONAA >60 11/24/2018   GFRAA >60 11/24/2018   CALCIUM 9.4 07/26/2020   PROT 7.4 07/26/2020   ALBUMIN 4.6 07/26/2020   LABGLOB 2.8 07/26/2020   AGRATIO 1.6 07/26/2020   BILITOT 0.8 07/26/2020   ALKPHOS 78 07/26/2020   AST 23 07/26/2020   ALT 20 07/26/2020   ANIONGAP 9 11/24/2018   Last lipids Lab Results  Component Value Date   CHOL 224 (H) 07/26/2020   HDL 42 07/26/2020   LDLCALC 143 (H) 07/26/2020   TRIG 213 (H) 07/26/2020   CHOLHDL 5.3 (H) 07/26/2020      Objective    BP 116/79   Pulse 69   Resp 16   Ht 6\' 4"  (1.93 m)   Wt (!) 302 lb 3.2 oz (137.1 kg)   SpO2 94%   BMI 36.78 kg/m    Physical Exam   General Appearance:    Obese male. Alert, cooperative, in no acute distress, appears stated age  Head:    Normocephalic, without obvious abnormality, atraumatic  Eyes:    PERRL, conjunctiva/corneas clear, EOM's intact, fundi    benign, both eyes       Ears:    Normal TM's and external ear canals, both ears  Nose:   Nares normal, septum midline, mucosa normal, no drainage   or sinus tenderness  Throat:   Lips, mucosa, and tongue normal; teeth and gums normal  Neck:   Supple, symmetrical, trachea midline, no adenopathy;       thyroid:  No enlargement/tenderness/nodules; no carotid   bruit or JVD  Back:     Symmetric, no curvature, ROM normal, no CVA tenderness  Lungs:     Clear to auscultation bilaterally, respirations unlabored  Chest wall:    No tenderness or deformity  Heart:     Normal heart rate. Normal rhythm. No murmurs, rubs, or gallops.  S1 and S2 normal  Abdomen:     Soft, non-tender, bowel sounds active all four quadrants,    no masses, no organomegaly  Genitalia:    deferred  Rectal:    deferred  Extremities:   All extremities are intact. No  cyanosis or edema  Pulses:   2+ and symmetric all extremities  Skin:   Skin color, texture, turgor normal, no rashes or lesions  Lymph nodes:   Cervical, supraclavicular, and axillary nodes normal  Neurologic:   CNII-XII intact. Normal strength, sensation and reflexes      throughout    Last depression screening scores PHQ 2/9 Scores 07/26/2020 08/09/2017  PHQ - 2 Score 0 0  PHQ- 9 Score 1 -   Last fall risk screening Fall Risk  08/09/2017  Falls in the past year? No   Last Audit-C alcohol use screening No flowsheet data found. A score of 3 or more in women, and 4 or more in men indicates increased risk for alcohol abuse, EXCEPT if all of the points are from question 1   No results found for any visits on 09/03/20.  Assessment & Plan    Routine Health Maintenance and Physical Exam  Exercise Activities and Dietary recommendations Goals   None     Immunization History  Administered Date(s) Administered  . Influenza,inj,quad, With Preservative 01/11/2017  . Influenza-Unspecified 01/27/2017  . Tdap 07/17/2017    Health Maintenance  Topic Date Due  . COVID-19 Vaccine (1) Never done  . HIV Screening  Never done  . Hepatitis C Screening  Never done  . COLONOSCOPY (Pts 45-35yrs Insurance coverage will need to be confirmed)  Never done  . INFLUENZA VACCINE  11/11/2020  . TETANUS/TDAP  07/18/2027  . HPV VACCINES  Aged Out    Discussed health benefits of physical activity, and encouraged him to engage in regular exercise appropriate for his age and condition.  Is scheduled for colonoscopy at [facility]   1. 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 He would like to proceed with ENT  referral for a look.   - Ambulatory referral to ENT  2. Gastroesophageal reflux disease, unspecified whether esophagitis present Change omeprazole to  RABEprazole (ACIPHEX) 20 MG tablet; Take 1 tablet (20 mg total) by mouth daily.  Dispense: 30 tablet; Refill: 2  3. Need for shingles vaccine  - Administer Zoster, Recombinant (Shingrix) Vaccine #1  4. Encounter for immunization  - PFIZER Comirnaty(GRAY TOP)COVID-19 Vaccine (#3)  5. Obesity Work on diet and exercise.      The entirety of the information documented in the History of Present Illness, Review of Systems and Physical Exam were personally obtained by me. Portions of this information were initially documented by the CMA and reviewed by me for thoroughness and accuracy.      Lelon Huh, MD  [facility] [phone] (phone) [phone] (fax)  [place]

## 2020-09-03 NOTE — Patient Instructions (Signed)

## 2020-09-27 ENCOUNTER — Ambulatory Visit: Payer: 59 | Admitting: Anesthesiology

## 2020-09-27 ENCOUNTER — Encounter
Admission: RE | Disposition: A | Discharge status: Home / Self Care | Payer: Self-pay | Source: Home / Self Care | Attending: Gastroenterology

## 2020-09-27 ENCOUNTER — Ambulatory Visit
Admission: RE | Admit: 2020-09-27 | Discharge: 2020-09-27 | Disposition: A | Discharge status: Home / Self Care | Payer: 59 | Attending: Gastroenterology | Admitting: Gastroenterology

## 2020-09-27 ENCOUNTER — Other Ambulatory Visit: Payer: Self-pay

## 2020-09-27 DIAGNOSIS — Z79899 Other long term (current) drug therapy: Secondary | ICD-10-CM | POA: Diagnosis not present

## 2020-09-27 DIAGNOSIS — Z1211 Encounter for screening for malignant neoplasm of colon: Secondary | ICD-10-CM | POA: Diagnosis not present

## 2020-09-27 DIAGNOSIS — K621 Rectal polyp: Secondary | ICD-10-CM | POA: Insufficient documentation

## 2020-09-27 DIAGNOSIS — Z881 Allergy status to other antibiotic agents status: Secondary | ICD-10-CM | POA: Insufficient documentation

## 2020-09-27 DIAGNOSIS — K635 Polyp of colon: Secondary | ICD-10-CM | POA: Diagnosis not present

## 2020-09-27 DIAGNOSIS — Z96652 Presence of left artificial knee joint: Secondary | ICD-10-CM | POA: Diagnosis present

## 2020-09-27 HISTORY — PX: COLONOSCOPY WITH PROPOFOL: SHX5780

## 2020-09-27 SURGERY — COLONOSCOPY WITH PROPOFOL
Anesthesia: General

## 2020-09-27 MED ORDER — DEXMEDETOMIDINE (PRECEDEX) IN NS 20 MCG/5ML (4 MCG/ML) IV SYRINGE
PREFILLED_SYRINGE | INTRAVENOUS | Status: AC
  Filled 2020-09-27: qty 5

## 2020-09-27 MED ORDER — PROPOFOL 10 MG/ML IV BOLUS
INTRAVENOUS | Status: DC | PRN
  Administered 2020-09-27: 80 mg via INTRAVENOUS

## 2020-09-27 MED ORDER — SODIUM CHLORIDE 0.9 % IV SOLN
INTRAVENOUS | Status: DC
Start: 2020-09-27 — End: 2020-09-27

## 2020-09-27 MED ORDER — PROPOFOL 500 MG/50ML IV EMUL
INTRAVENOUS | Status: DC | PRN
  Administered 2020-09-27: 180 ug/kg/min via INTRAVENOUS

## 2020-09-27 MED ORDER — PROPOFOL 500 MG/50ML IV EMUL
INTRAVENOUS | Status: AC
  Filled 2020-09-27: qty 50

## 2020-09-27 MED ORDER — DEXMEDETOMIDINE (PRECEDEX) IN NS 20 MCG/5ML (4 MCG/ML) IV SYRINGE
PREFILLED_SYRINGE | INTRAVENOUS | Status: DC | PRN
  Administered 2020-09-27: 8 ug via INTRAVENOUS
  Administered 2020-09-27: 4 ug via INTRAVENOUS

## 2020-09-27 MED ORDER — LIDOCAINE HCL (CARDIAC) PF 100 MG/5ML IV SOSY
PREFILLED_SYRINGE | INTRAVENOUS | Status: DC | PRN
  Administered 2020-09-27: 60 mg via INTRAVENOUS

## 2020-09-27 MED ORDER — GLYCOPYRROLATE 0.2 MG/ML IJ SOLN
INTRAMUSCULAR | Status: DC | PRN
  Administered 2020-09-27: .2 mg via INTRAVENOUS

## 2020-09-27 NOTE — Anesthesia Postprocedure Evaluation (Signed)
Anesthesia Post Note  Patient: Nicholas Hobbs  Procedure(s) Performed: COLONOSCOPY WITH PROPOFOL  Patient location during evaluation: Endoscopy Anesthesia Type: General Level of consciousness: awake and alert and oriented Pain management: pain level controlled Vital Signs Assessment: post-procedure vital signs reviewed and stable Respiratory status: spontaneous breathing, nonlabored ventilation and respiratory function stable Cardiovascular status: blood pressure returned to baseline and stable Postop Assessment: no signs of nausea or vomiting Anesthetic complications: no   No notable events documented.   Last Vitals:  Vitals:   09/27/20 1110 09/27/20 1120  BP: 117/84 104/80  Pulse: 61 (!) 59  Resp: 15 15  Temp:    SpO2: 95% 98%    Last Pain:  Vitals:   09/27/20 1040  TempSrc: Temporal  PainSc:                  Sharhonda Atwood

## 2020-09-27 NOTE — Transfer of Care (Signed)
Immediate Anesthesia Transfer of Care Note  Patient: Nicholas Hobbs  Procedure(s) Performed: COLONOSCOPY WITH PROPOFOL  Patient Location: Endoscopy Unit  Anesthesia Type:General  Level of Consciousness: drowsy  Airway & Oxygen Therapy: Patient Spontanous Breathing  Post-op Assessment: Report given to RN and Post -op Vital signs reviewed and stable  Post vital signs: Reviewed and stable  Last Vitals:  Vitals Value Taken Time  BP 102/70 09/27/20 1048  Temp    Pulse 77 09/27/20 1048  Resp 14 09/27/20 1048  SpO2 92 % 09/27/20 1048  Vitals shown include unvalidated device data.  Last Pain:  Vitals:   09/27/20 0922  TempSrc: Temporal  PainSc: 0-No pain         Complications: No notable events documented.

## 2020-09-27 NOTE — Op Note (Signed)
[facility] Gastroenterology Patient Name: Nicholas Hobbs Procedure Date: 09/27/2020 10:01 AM MRN: [id number] Account #: [id number] Date of Birth: Oct 15, 1970 Admit Type: Outpatient Age: 50 Room: [facility] ENDO ROOM 2 Gender: Male Note Status: Finalized Procedure:             Colonoscopy Indications:           Screening for colorectal malignant neoplasm Providers:             Nikoli Nasser B. Bonna Gains MD, MD Referring MD:          Kirstie Peri. Caryn Section, MD (Referring MD) Medicines:             Monitored Anesthesia Care Complications:         No immediate complications. Procedure:             Pre-Anesthesia Assessment:                        - Prior to the procedure, a History and Physical was                         performed, and patient medications, allergies and                         sensitivities were reviewed. The patient's tolerance                         of previous anesthesia was reviewed.                        - The risks and benefits of the procedure and the                         sedation options and risks were discussed with the                         patient. All questions were answered and informed                         consent was obtained.                        - Patient identification and proposed procedure were                         verified prior to the procedure by the physician, the                         nurse, the anesthetist and the technician. The                         procedure was verified in the pre-procedure area in                         the procedure room in the endoscopy suite.                        - ASA Grade Assessment: II - A patient with mild  systemic disease.                        - After reviewing the risks and benefits, the patient                         was deemed in satisfactory condition to undergo the                         procedure.                        After obtaining informed consent, the  colonoscope was                         passed under direct vision. Throughout the procedure,                         the patient's blood pressure, pulse, and oxygen                         saturations were monitored continuously. The                         Colonoscope was introduced through the anus and                         advanced to the the cecum, identified by appendiceal                         orifice and ileocecal valve. The colonoscopy was                         performed with ease. The patient tolerated the                         procedure well. The quality of the bowel preparation                         was [Prep Quality]. Findings:      The perianal and digital rectal examinations were normal.      A 5 mm polyp was found in the rectum. The polyp was sessile. The polyp       was removed with a cold snare. Resection and retrieval were complete.      The exam was otherwise normal throughout the examined colon.      The rectum, sigmoid colon, descending colon, transverse colon, ascending       colon and cecum appeared normal.      The retroflexed view of the distal rectum and anal verge was normal and       showed no anal or rectal abnormalities. Impression:            - One 5 mm polyp in the rectum, removed with a cold                         snare. Resected and retrieved.                        - The rectum, sigmoid colon, descending colon,  transverse colon, ascending colon and cecum are normal.                        - The distal rectum and anal verge are normal on                         retroflexion view. Recommendation:        - Await pathology results.                        - Repeat colonoscopy date to be determined after                         pending pathology results are reviewed for                         surveillance based on pathology results.                        - Discharge patient to home.                        - Resume  previous diet.                        - Continue present medications.                        - Return to primary care physician as previously                         scheduled.                        - The findings and recommendations were discussed with                         the patient.                        - The findings and recommendations were discussed with                         the patient's family. Procedure Code(s):     --- Professional ---                        [phone], Colonoscopy, flexible; with removal of                         tumor(s), polyp(s), or other lesion(s) by snare                         technique Diagnosis Code(s):     --- Professional ---                        Z12.11, Encounter for screening for malignant neoplasm                         of colon  K62.1, Rectal polyp CPT copyright 2019 American Medical Association. All rights reserved. The codes documented in this report are preliminary and upon coder review may  be revised to meet current compliance requirements.  [name], MD Margretta Sidle B. Bonna Gains MD, MD 09/27/2020 10:56:59 AM This report has been signed electronically. Number of Addenda: 0 Note Initiated On: 09/27/2020 10:01 AM Scope Withdrawal Time: 0 hours 24 minutes 3 seconds  Total Procedure Duration: 0 hours 27 minutes 9 seconds       [facility]

## 2020-09-27 NOTE — Anesthesia Preprocedure Evaluation (Signed)
Anesthesia Evaluation  Patient identified by MRN, date of birth, ID band Patient awake    Reviewed: Allergy & Precautions, NPO status , Patient's Chart, lab work & pertinent test results  History of Anesthesia Complications Negative for: history of anesthetic complications  Airway Mallampati: II  TM Distance: >3 FB Neck ROM: Full    Dental no notable dental hx.    Pulmonary sleep apnea , neg COPD,    breath sounds clear to auscultation- rhonchi (-) wheezing      Cardiovascular Exercise Tolerance: Good (-) hypertension(-) CAD, (-) Past MI, (-) Cardiac Stents and (-) CABG  Rhythm:Regular Rate:Normal - Systolic murmurs and - Diastolic murmurs    Neuro/Psych neg Seizures negative neurological ROS  negative psych ROS   GI/Hepatic Neg liver ROS, GERD  ,  Endo/Other  negative endocrine ROSneg diabetes  Renal/GU negative Renal ROS     Musculoskeletal  (+) Arthritis ,   Abdominal (+) + obese,   Peds  Hematology negative hematology ROS (+)   Anesthesia Other Findings Past Medical History: No date: GERD (gastroesophageal reflux disease) No date: Pancreatitis No date: Rib fracture No date: Shoulder fracture, right No date: Sleep apnea   Reproductive/Obstetrics                             Anesthesia Physical Anesthesia Plan  ASA: 2  Anesthesia Plan: General   Post-op Pain Management:    Induction: Intravenous  PONV Risk Score and Plan: 1 and Propofol infusion  Airway Management Planned: Natural Airway  Additional Equipment:   Intra-op Plan:   Post-operative Plan:   Informed Consent: I have reviewed the patients History and Physical, chart, labs and discussed the procedure including the risks, benefits and alternatives for the proposed anesthesia with the patient or authorized representative who has indicated his/her understanding and acceptance.     Dental advisory given  Plan  Discussed with: CRNA and Anesthesiologist  Anesthesia Plan Comments:         Anesthesia Quick Evaluation

## 2020-09-27 NOTE — Anesthesia Procedure Notes (Signed)
Date/Time: 09/27/2020 10:12 AM Performed by: Lily Peer, Javonnie Illescas, CRNA Pre-anesthesia Checklist: Patient identified, Emergency Drugs available, Suction available, Timeout performed and Patient being monitored Patient Re-evaluated:Patient Re-evaluated prior to induction Oxygen Delivery Method: Nasal cannula Induction Type: IV induction

## 2020-09-27 NOTE — H&P (Signed)
Nicholas Antigua, MD [address], [place], [place], [place], [address] [address], [place], [place], [place], [address] Phone: [phone]  Fax: [phone]  Primary Care Physician:  Birdie Sons, MD   Pre-Procedure History & Physical: HPI:  Nicholas Hobbs is a 50 y.o. male is here for a colonoscopy.   Past Medical History:  Diagnosis Date   GERD (gastroesophageal reflux disease)    Pancreatitis    Rib fracture    Shoulder fracture, right    Sleep apnea     Past Surgical History:  Procedure Laterality Date   APPENDECTOMY  2007   ESOPHAGOGASTRODUODENOSCOPY (EGD) WITH PROPOFOL N/A 08/28/2016   Procedure: ESOPHAGOGASTRODUODENOSCOPY (EGD) WITH PROPOFOL WITH DILATION;  Surgeon: Jonathon Bellows, MD;  Location: [facility] ENDOSCOPY;  Service: Endoscopy;  Laterality: N/A;   KNEE ARTHROPLASTY Left 03/23/2018   Procedure: COMPUTER ASSISTED TOTAL KNEE ARTHROPLASTY;  Surgeon: Dereck Leep, MD;  Location: [facility] ORS;  Service: Orthopedics;  Laterality: Left;   KNEE SURGERY Right 06/2014   meniscus repair    Prior to Admission medications   Medication Sig Start Date End Date Taking? Authorizing Provider  Ascorbic Acid (VITAMIN C) 1000 MG tablet Take 1,000 mg by mouth daily.    Yes [provider]  cetirizine (ZYRTEC) 10 MG tablet Take 10 mg by mouth daily as needed for allergies.   Yes [provider]  Multiple Vitamins-Minerals (MULTIVITAMIN WITH MINERALS) tablet Take 1 tablet by mouth daily.   Yes [provider]  omeprazole (PRILOSEC) 20 MG capsule Take 20 mg by mouth daily.   Yes [provider]  PARoxetine (PAXIL) 20 MG tablet Take 0.5-1 tablets (10-20 mg total) by mouth daily. 07/26/20  Yes Birdie Sons, MD  RABEprazole (ACIPHEX) 20 MG tablet Take 1 tablet (20 mg total) by mouth daily. 09/03/20   Birdie Sons, MD  sildenafil (REVATIO) 20 MG tablet TAKE 2-5 TABLETS BY MOUTH ONCE DAILY AS NEEDED FOR ERECTILE DYSFUNCTION 08/02/20   Birdie Sons, MD  sildenafil (VIAGRA) 50 MG tablet Take 1 tablet (50 mg total) by mouth daily as needed for erectile dysfunction. 07/26/20   Birdie Sons, MD    Allergies as of 08/14/2020 - Review Complete 08/14/2020  Allergen Reaction Noted   Biaxin [clarithromycin] Itching    Cat hair extract Other (See Comments) 12/03/2014   Other Itching 02/01/2012    Family History  Problem Relation Age of Onset   Seizures Father     Social History   Socioeconomic History   Marital status: Divorced    Spouse name: Not on file   Number of children: Not on file   Years of education: Not on file   Highest education level: Not on file  Occupational History   Not on file  Tobacco Use   Smoking status: Never   Smokeless tobacco: Former    Types: Sarina Ser    Quit date: 03/2017  Vaping Use   Vaping Use: Never used  Substance and Sexual Activity   Alcohol use: Yes    Alcohol/week: 0.0 standard drinks   Drug use: Never   Sexual activity: Not on file  Other Topics Concern   Not on file  Social History Narrative   Not on file   Social Determinants of Health   Financial Resource Strain: Not on file  Food Insecurity: Not on file  Transportation Needs: Not on file  Physical Activity: Not on file  Stress: Not on file  Social Connections: Not on file  Intimate Partner Violence: Not on file    Review of Systems: See HPI, otherwise negative ROS  Physical Exam: BP 124/76   Pulse 65   Temp (!) 96.7 F (35.9 C) (Temporal)   Resp 20   Ht 6\' 4"  (1.93 m)   Wt 130.6 kg   SpO2 96%   BMI 35.06 kg/m  General:   Alert,  pleasant and cooperative in NAD Head:  Normocephalic and atraumatic. Neck:  Supple; no masses or thyromegaly. Lungs:  Clear throughout to auscultation, normal respiratory effort.    Heart:  +S1, +S2, Regular rate and rhythm, No edema. Abdomen:  Soft, nontender and nondistended. Normal bowel sounds, without guarding, and without rebound.   Neurologic:  Alert and  oriented  x4;  grossly normal neurologically.  Impression/Plan: Nicholas Hobbs is here for a colonoscopy to be performed for average risk screening.  Risks, benefits, limitations, and alternatives regarding  colonoscopy have been reviewed with the patient.  Questions have been answered.  All parties agreeable.   Virgel Manifold, MD  09/27/2020, 10:02 AM

## 2020-09-28 NOTE — Progress Notes (Signed)
Voicemail. No message left.

## 2020-09-30 ENCOUNTER — Encounter: Payer: Self-pay | Admitting: Gastroenterology

## 2020-09-30 LAB — SURGICAL PATHOLOGY

## 2020-10-01 ENCOUNTER — Encounter: Payer: Self-pay | Admitting: Gastroenterology

## 2021-01-14 ENCOUNTER — Telehealth: Payer: Self-pay

## 2021-03-20 ENCOUNTER — Encounter: Payer: Self-pay | Admitting: Gastroenterology

## 2021-03-20 ENCOUNTER — Ambulatory Visit (INDEPENDENT_AMBULATORY_CARE_PROVIDER_SITE_OTHER): Payer: 59 | Admitting: Gastroenterology

## 2021-03-20 ENCOUNTER — Other Ambulatory Visit: Payer: Self-pay

## 2021-03-20 VITALS — BP 118/79 | HR 86 | Temp 98.4°F | Wt 309.0 lb

## 2021-03-20 DIAGNOSIS — K219 Gastro-esophageal reflux disease without esophagitis: Secondary | ICD-10-CM

## 2021-03-20 DIAGNOSIS — R1310 Dysphagia, unspecified: Secondary | ICD-10-CM

## 2021-03-20 MED ORDER — OMEPRAZOLE 40 MG PO CPDR: 40.0000 mg | DELAYED_RELEASE_CAPSULE | Freq: Every day | ORAL | 1 refills | Status: DC

## 2021-03-20 NOTE — Progress Notes (Signed)
Nicholas Hobbs [address]  [place]  [place], [place] [address]  Main: [phone]  Fax: [phone]   Gastroenterology Consultation  Referring Provider:     Birdie Sons, MD Primary Care Physician:  Birdie Sons, MD Reason for Consultation:     GERD, dysphagia        HPI:    Chief Complaint  Patient presents with   Gastroesophageal Reflux    Nicholas Hobbs is a 50 y.o. y/o male referred for consultation & management  by Dr. Caryn Section, Kirstie Peri, MD. patient reports chronic history of GERD and has been on PPI for over 10 years but takes it every other day to minimize adverse effects that he recently heard about over the last couple years.  However, reports having to clear his throat constantly and states that the dentist recently told him that he saw changes on his teeth from acid.  He also reports intermittent dysphagia to solid foods and states he had an EGD a long time ago when dilation was done but he does not have these records.  No episodes of food impaction.  No weight loss.  No nausea or vomiting.  No altered bowel habits or blood in stool.  Records in care everywhere reviewed and show gastroenterology records from 2013 at [facility].  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 Their note summarizes:  "CT abd 01/2012: Signs of acute pancreatitis but no necrosis or complication. No signs of chronic pancreatitis. No stones.  MRCP 2010 OSH: Normal according to pt  EUS 05/2004: - Savary-Miller Grade IV reflux esophagitis. - Nodular mucosa in the duodenal bulb. Biopsied. - There was no sign of significant pathology in the pancreatic head, in the pancreatic body, in the pancreatic tail and in the main pancreatic duct. - There was no sign of significant pathology in the main pancreatic duct. - There was no sign of significant pathology in the common bile  duct. - There was no sign of significant pathology in the gallbladder.  EGD 07/2004: - Savary-Miller Grade IV reflux esophagitis. - Nodular mucosa in the cardia. - Normal examined duodenum.  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 This was treated with pancreatic sphincterotomy. Biliary sphincterotomy was performed to complete the ablation. "  Patient had an ERCP at [facility] in 2013 as well I do not have the procedure record.  Patient underwent EGD with Dr. Vicente Males in 2018 that showed esophagitis and recommendations were for repeat EGD dysphagia continues after 6 weeks of treatment.  Patient has not followed up in clinic since then  Patient also underwent MRCP ordered by Dr. Vicente Males in 2018 that reported normal pancreas  Past Medical History:  Diagnosis Date   GERD (gastroesophageal reflux disease)    Pancreatitis    Rib fracture    Shoulder fracture, right    Sleep apnea     Past Surgical History:  Procedure Laterality Date   APPENDECTOMY  2007   COLONOSCOPY WITH PROPOFOL N/A 09/27/2020   Procedure: COLONOSCOPY WITH PROPOFOL;  Surgeon: Virgel Manifold, MD;  Location: [facility] ENDOSCOPY;  Service: Endoscopy;  Laterality: N/A;   ESOPHAGOGASTRODUODENOSCOPY (EGD) WITH PROPOFOL N/A 08/28/2016   Procedure: ESOPHAGOGASTRODUODENOSCOPY (EGD) WITH PROPOFOL WITH DILATION;  Surgeon: Jonathon Bellows, MD;  Location: [facility] ENDOSCOPY;  Service: Endoscopy;  Laterality: N/A;   KNEE ARTHROPLASTY Left 03/23/2018   Procedure: COMPUTER ASSISTED TOTAL KNEE ARTHROPLASTY;  Surgeon: Skip Estimable  P, MD;  Location: [facility] ORS;  Service: Orthopedics;  Laterality: Left;   KNEE SURGERY Right 06/2014   meniscus repair    Prior to Admission medications   Medication Sig Start Date End Date Taking? Authorizing Provider  Ascorbic Acid (VITAMIN C) 1000 MG tablet Take 1,000 mg by mouth daily.    Yes [provider]  cetirizine (ZYRTEC) 10 MG tablet Take 10 mg by mouth daily as needed for allergies.    Yes [provider]  Multiple Vitamins-Minerals (MULTIVITAMIN WITH MINERALS) tablet Take 1 tablet by mouth daily.   Yes [provider]  omeprazole (PRILOSEC) 40 MG capsule Take 1 capsule (40 mg total) by mouth daily. 03/20/21  Yes Nicholas Hobbs B, MD  PARoxetine (PAXIL) 20 MG tablet Take 0.5-1 tablets (10-20 mg total) by mouth daily. 07/26/20  Yes Birdie Sons, MD  sildenafil (REVATIO) 20 MG tablet TAKE 2-5 TABLETS BY MOUTH ONCE DAILY AS NEEDED FOR ERECTILE DYSFUNCTION 08/02/20  Yes Birdie Sons, MD  sildenafil (VIAGRA) 50 MG tablet Take 1 tablet (50 mg total) by mouth daily as needed for erectile dysfunction. 07/26/20  Yes Birdie Sons, MD    Family History  Problem Relation Age of Onset   Seizures Father      Social History   Tobacco Use   Smoking status: Never   Smokeless tobacco: Former    Types: Chew    Quit date: 03/2017  Vaping Use   Vaping Use: Never used  Substance Use Topics   Alcohol use: Yes    Alcohol/week: 0.0 standard drinks   Drug use: Never    Allergies as of 03/20/2021 - Review Complete 03/20/2021  Allergen Reaction Noted   Biaxin [clarithromycin] Itching    Cat hair extract Other (See Comments) 12/03/2014   Other Itching 02/01/2012    Review of Systems:    All systems reviewed and negative except where noted in HPI.   Physical Exam:  Constitutional: General:   Alert,  Well-developed, well-nourished, pleasant and cooperative in NAD BP 118/79   Pulse 86   Temp 98.4 F (36.9 C) (Oral)   Wt (!) 309 lb (140.2 kg)   BMI 37.61 kg/m   Eyes:  Sclera clear, no icterus.   Conjunctiva pink. PERRLA  Ears:  No scars, lesions or masses, Normal auditory acuity. Nose:  No deformity, discharge, or lesions. Mouth:  No deformity or lesions, oropharynx pink & moist.  Neck:  Supple; no masses or thyromegaly.  Respiratory: Normal respiratory effort, Normal percussion  Gastrointestinal: Soft, non-tender and non-distended without  masses, hepatosplenomegaly or hernias noted.  No guarding or rebound tenderness.     Cardiac: No clubbing or edema.  No cyanosis. Normal posterior tibial pedal pulses noted.  Lymphatic:  No significant cervical or axillary adenopathy.  Psych:  Alert and cooperative. Normal mood and affect.  Musculoskeletal:  Normal gait. Head normocephalic, atraumatic. Symmetrical without gross deformities. 5/5 Upper and Lower extremity strength bilaterally.  Skin: Warm. Intact without significant lesions or rashes. No jaundice.  Neurologic:  Face symmetrical, tongue midline, Normal sensation to touch;  grossly normal neurologically.  Psych:  Alert and oriented x3, Alert and cooperative. Normal mood and affect.   Labs: CBC    Component Value Date/Time   WBC 8.3 07/26/2020 1537   WBC 14.1 (H) 11/24/2018 1810   RBC 5.56 07/26/2020 1537   RBC 5.31 11/24/2018 1810   HGB 16.1 07/26/2020 1537   HCT 47.5 07/26/2020 1537   PLT 188 07/26/2020 1537  MCV 85 07/26/2020 1537   MCV 88 01/19/2012 0609   MCH 29.0 07/26/2020 1537   MCH 28.8 11/24/2018 1810   MCHC 33.9 07/26/2020 1537   MCHC 33.6 11/24/2018 1810   RDW 12.9 07/26/2020 1537   RDW 13.1 01/19/2012 0609   LYMPHSABS 1.1 12/01/2017 2320   LYMPHSABS 1.3 01/19/2012 0609   MONOABS 0.1 (L) 12/01/2017 2320   MONOABS 0.9 01/19/2012 0609   EOSABS 0.1 12/01/2017 2320   EOSABS 0.2 01/19/2012 0609   BASOSABS 0.0 12/01/2017 2320   BASOSABS 0.1 01/19/2012 0609   CMP     Component Value Date/Time   NA 140 07/26/2020 1537   NA 140 01/19/2012 0609   K 4.2 07/26/2020 1537   K 4.3 01/19/2012 0609   CL 103 07/26/2020 1537   CL 106 01/19/2012 0609   CO2 21 07/26/2020 1537   CO2 26 01/19/2012 0609   GLUCOSE 100 (H) 07/26/2020 1537   GLUCOSE 114 (H) 11/24/2018 1810   GLUCOSE 110 (H) 01/19/2012 0609   BUN 14 07/26/2020 1537   BUN 13 01/19/2012 0609   CREATININE 1.02 07/26/2020 1537   CREATININE 0.89 01/19/2012 0609   CALCIUM 9.4 07/26/2020 1537    CALCIUM 8.3 (L) 01/19/2012 0609   PROT 7.4 07/26/2020 1537   PROT 6.9 01/19/2012 0609   ALBUMIN 4.6 07/26/2020 1537   ALBUMIN 3.4 01/19/2012 0609   AST 23 07/26/2020 1537   AST 17 01/19/2012 0609   ALT 20 07/26/2020 1537   ALT 21 01/19/2012 0609   ALKPHOS 78 07/26/2020 1537   ALKPHOS 74 01/19/2012 0609   BILITOT 0.8 07/26/2020 1537   BILITOT 1.2 (H) 01/19/2012 0609   GFRNONAA >60 11/24/2018 1810   GFRNONAA >60 01/19/2012 0609   GFRAA >60 11/24/2018 1810   GFRAA >60 01/19/2012 0609    Imaging Studies: No results found.  Assessment and Plan:   Nicholas Hobbs is a 50 y.o. y/o male has been referred for GERD and dysphagia and previous history of recurrent acute pancreatitis as stated in HPI  Given that previously patient had dysphagia in 2018 and was found to have esophagitis, patient may have a Schatzki's ring that developed from chronic reflux that needs dilation  Patient indicated for further evaluation  Patient advised to take his omeprazole daily given that every other day dosing is not controlling his symptoms well and may be leading to esophagitis as well  (Risks of PPI use were discussed with patient including bone loss, C. Diff diarrhea, pneumonia, infections, CKD, electrolyte abnormalities.  Pt. Verbalizes understanding and chooses to continue the medication.)  Patient educated on anti-reflux lifestyle modification, including using a bed wedge, not eating 3 hrs before bedtime, diet modifications, and handout given for the same.   With institution of lifestyle modifications, if symptoms improve, PPI dosing can be decreased or changed to H2 RA after EGD  Clinic follow-up in 2 to 3 months or earlier if needed  I have discussed alternative options, risks & benefits,  which include, but are not limited to, bleeding, infection, perforation,respiratory complication & drug reaction.  The patient agrees with this plan & written consent will be obtained.     Dr Nicholas Hobbs  Speech recognition software was used to dictate the above note.

## 2021-03-24 ENCOUNTER — Ambulatory Visit (INDEPENDENT_AMBULATORY_CARE_PROVIDER_SITE_OTHER): Payer: 59 | Admitting: Podiatry

## 2021-03-24 ENCOUNTER — Encounter: Payer: Self-pay | Admitting: Podiatry

## 2021-03-24 ENCOUNTER — Other Ambulatory Visit: Payer: Self-pay

## 2021-03-24 DIAGNOSIS — L02612 Cutaneous abscess of left foot: Secondary | ICD-10-CM

## 2021-03-24 DIAGNOSIS — L03032 Cellulitis of left toe: Secondary | ICD-10-CM

## 2021-03-24 MED ORDER — CEPHALEXIN 500 MG PO CAPS: 500.0000 mg | ORAL_CAPSULE | Freq: Three times a day (TID) | ORAL | 0 refills | Status: AC

## 2021-03-24 NOTE — Progress Notes (Signed)
  Subjective:  Patient ID: Nicholas Hobbs, male    DOB: June 09, 1970,  MRN: [id number]  Chief Complaint  Patient presents with   Ingrown Toenail     Dr Milinda Pointer pt- infected toe Left foot    50 y.o. male presents with the above complaint. History confirmed with patient.  Notices developing over the weekend and yesterday looked really infected but looks a bit better today, no injury does not think he cut the skin or cause an ingrown or anything  Objective:  Physical Exam: warm, good capillary refill, no trophic changes or ulcerative lesions, normal DP and PT pulses, and normal sensory exam. Left Foot: Cellulitis of nail fold proximal of left third toe, no abscess or purulence noted nail is well attached and no paronychia is noted     Assessment:  No diagnosis found.   Plan:  Patient was evaluated and treated and all questions answered.  Appears to have cellulitis of the proximal nail fold.  I recommended treatment with antibiotics and sent a prescription for Keflex.  I will reevaluate him in 2 weeks.  If not improved by then then would plan for permanent partial temporary total nail avulsion of the third toenail  Return in 16 days (on 04/09/2021) for follow up on toe infection .

## 2021-04-16 ENCOUNTER — Ambulatory Visit: Payer: 59 | Admitting: Podiatry

## 2021-04-25 ENCOUNTER — Ambulatory Visit: Payer: 59 | Admitting: Certified Registered Nurse Anesthetist

## 2021-04-25 ENCOUNTER — Encounter: Payer: Self-pay | Admitting: Gastroenterology

## 2021-04-25 ENCOUNTER — Encounter
Admission: RE | Disposition: A | Discharge status: Home / Self Care | Payer: Self-pay | Source: Ambulatory Visit | Attending: Gastroenterology

## 2021-04-25 ENCOUNTER — Ambulatory Visit
Admission: RE | Admit: 2021-04-25 | Discharge: 2021-04-25 | Disposition: A | Discharge status: Home / Self Care | Payer: 59 | Source: Ambulatory Visit | Attending: Gastroenterology | Admitting: Gastroenterology

## 2021-04-25 DIAGNOSIS — Z87891 Personal history of nicotine dependence: Secondary | ICD-10-CM | POA: Diagnosis not present

## 2021-04-25 DIAGNOSIS — R1310 Dysphagia, unspecified: Secondary | ICD-10-CM | POA: Diagnosis present

## 2021-04-25 HISTORY — PX: ESOPHAGOGASTRODUODENOSCOPY (EGD) WITH PROPOFOL: SHX5813

## 2021-04-25 SURGERY — ESOPHAGOGASTRODUODENOSCOPY (EGD) WITH PROPOFOL
Anesthesia: General

## 2021-04-25 MED ORDER — SODIUM CHLORIDE 0.9 % IV SOLN: INTRAVENOUS | Status: DC

## 2021-04-25 MED ORDER — PHENYLEPHRINE HCL-NACL 20-0.9 MG/250ML-% IV SOLN
INTRAVENOUS | Status: AC
  Filled 2021-04-25: qty 250

## 2021-04-25 MED ORDER — SODIUM CHLORIDE 0.9 % IV SOLN
INTRAVENOUS | Status: DC | PRN
Start: 2021-04-25 — End: 2021-04-25

## 2021-04-25 MED ORDER — PROPOFOL 500 MG/50ML IV EMUL
INTRAVENOUS | Status: DC | PRN
  Administered 2021-04-25: 100 mg via INTRAVENOUS
  Administered 2021-04-25: 180 ug/kg/min via INTRAVENOUS
  Administered 2021-04-25: 50 mg via INTRAVENOUS

## 2021-04-25 MED ORDER — PROPOFOL 500 MG/50ML IV EMUL
INTRAVENOUS | Status: AC
  Filled 2021-04-25: qty 100

## 2021-04-25 MED ORDER — GLYCOPYRROLATE 0.2 MG/ML IJ SOLN
INTRAMUSCULAR | Status: DC | PRN
Start: 2021-04-25 — End: 2021-04-25
  Administered 2021-04-25: .2 mg via INTRAVENOUS

## 2021-04-25 NOTE — Transfer of Care (Signed)
Immediate Anesthesia Transfer of Care Note  Patient: Nicholas Hobbs  Procedure(s) Performed: ESOPHAGOGASTRODUODENOSCOPY (EGD) WITH PROPOFOL  Patient Location: PACU and Endoscopy Unit  Anesthesia Type:General  Level of Consciousness: drowsy  Airway & Oxygen Therapy: Patient Spontanous Breathing and Patient connected to nasal cannula oxygen  Post-op Assessment: Report given to RN  Post vital signs: Reviewed and stable  Last Vitals:  Vitals Value Taken Time  BP 122/81 04/25/21 0759  Temp 36.1 C 04/25/21 0759  Pulse 79 04/25/21 0759  Resp 14 04/25/21 0759  SpO2 93 % 04/25/21 0759    Last Pain:  Vitals:   04/25/21 0759  TempSrc: Temporal  PainSc: Asleep         Complications: No notable events documented.

## 2021-04-25 NOTE — Op Note (Signed)
[facility] Gastroenterology Patient Name: Nicholas Hobbs Procedure Date: 04/25/2021 7:38 AM MRN: [id number] Account #: [id number] Date of Birth: January 01, 1971 Admit Type: Outpatient Age: 51 Room: [facility] ENDO ROOM 4 Gender: Male Note Status: Finalized Instrument Name: Altamese Cabal Endoscope [id number] Procedure:             Upper GI endoscopy Indications:           Dysphagia Providers:             Jonathon Bellows MD, MD Referring MD:          Kirstie Peri. Caryn Section, MD (Referring MD) Medicines:             Monitored Anesthesia Care Complications:         No immediate complications. Procedure:             Pre-Anesthesia Assessment:                        - Prior to the procedure, a History and Physical was                         performed, and patient medications, allergies and                         sensitivities were reviewed. The patient's tolerance                         of previous anesthesia was reviewed.                        - The risks and benefits of the procedure and the                         sedation options and risks were discussed with the                         patient. All questions were answered and informed                         consent was obtained.                        - ASA Grade Assessment: II - A patient with mild                         systemic disease.                        After obtaining informed consent, the endoscope was                         passed under direct vision. Throughout the procedure,                         the patient's blood pressure, pulse, and oxygen                         saturations were monitored continuously. The Endoscope                         was introduced  through the mouth, and advanced to the                         third part of duodenum. The upper GI endoscopy was                         accomplished with ease. The patient tolerated the                         procedure well. Findings:      The examined esophagus  was normal. Biopsies were taken with a cold       forceps for histology.      The stomach was normal.      The examined duodenum was normal.      The cardia and gastric fundus were normal on retroflexion. Impression:            - Normal esophagus. Biopsied.                        - Normal stomach.                        - Normal examined duodenum. Recommendation:        - Discharge patient to home (with escort).                        - Resume previous diet.                        - Continue present medications.                        - Await pathology results.                        - Return to my office in 6 weeks. Procedure Code(s):     --- Professional ---                        [phone], Esophagogastroduodenoscopy, flexible,                         transoral; with biopsy, single or multiple Diagnosis Code(s):     --- Professional ---                        R13.10, Dysphagia, unspecified CPT copyright 2019 American Medical Association. All rights reserved. The codes documented in this report are preliminary and upon coder review may  be revised to meet current compliance requirements. Jonathon Bellows, MD Jonathon Bellows MD, MD 04/25/2021 7:57:18 AM This report has been signed electronically. Number of Addenda: 0 Note Initiated On: 04/25/2021 7:38 AM Estimated Blood Loss:  Estimated blood loss: none.      [facility]

## 2021-04-25 NOTE — H&P (Signed)
Jonathon Bellows, MD [address] [place], [place], [place], [address] [address], [place], [place], [place], [address] Phone: [phone]  Fax: [phone]  Primary Care Physician:  Birdie Sons, MD   Pre-Procedure History & Physical: HPI:  Nicholas Hobbs is a 51 y.o. male is here for an endoscopy    Past Medical History:  Diagnosis Date   GERD (gastroesophageal reflux disease)    Pancreatitis    Rib fracture    Shoulder fracture, right    Sleep apnea     Past Surgical History:  Procedure Laterality Date   APPENDECTOMY  2007   COLONOSCOPY WITH PROPOFOL N/A 09/27/2020   Procedure: COLONOSCOPY WITH PROPOFOL;  Surgeon: Virgel Manifold, MD;  Location: [facility] ENDOSCOPY;  Service: Endoscopy;  Laterality: N/A;   ESOPHAGOGASTRODUODENOSCOPY (EGD) WITH PROPOFOL N/A 08/28/2016   Procedure: ESOPHAGOGASTRODUODENOSCOPY (EGD) WITH PROPOFOL WITH DILATION;  Surgeon: Jonathon Bellows, MD;  Location: [facility] ENDOSCOPY;  Service: Endoscopy;  Laterality: N/A;   KNEE ARTHROPLASTY Left 03/23/2018   Procedure: COMPUTER ASSISTED TOTAL KNEE ARTHROPLASTY;  Surgeon: Dereck Leep, MD;  Location: [facility] ORS;  Service: Orthopedics;  Laterality: Left;   KNEE SURGERY Right 06/2014   meniscus repair    Prior to Admission medications   Medication Sig Start Date End Date Taking? Authorizing Provider  cetirizine (ZYRTEC) 10 MG tablet Take 10 mg by mouth daily as needed for allergies.   Yes [provider]  omeprazole (PRILOSEC) 40 MG capsule Take 1 capsule (40 mg total) by mouth daily. 03/20/21  Yes [name] B, MD  PARoxetine (PAXIL) 20 MG tablet Take 0.5-1 tablets (10-20 mg total) by mouth daily. 07/26/20  Yes Birdie Sons, MD  Ascorbic Acid (VITAMIN C) 1000 MG tablet Take 1,000 mg by mouth daily.     [provider]  Multiple Vitamins-Minerals (MULTIVITAMIN WITH MINERALS) tablet Take 1 tablet by mouth daily.    [provider]  sildenafil (REVATIO) 20 MG tablet  TAKE 2-5 TABLETS BY MOUTH ONCE DAILY AS NEEDED FOR ERECTILE DYSFUNCTION 08/02/20   Birdie Sons, MD  sildenafil (VIAGRA) 50 MG tablet Take 1 tablet (50 mg total) by mouth daily as needed for erectile dysfunction. 07/26/20   Birdie Sons, MD    Allergies as of 03/21/2021 - Review Complete 03/20/2021  Allergen Reaction Noted   Biaxin [clarithromycin] Itching    Cat hair extract Other (See Comments) 12/03/2014   Other Itching 02/01/2012    Family History  Problem Relation Age of Onset   Seizures Father     Social History   Socioeconomic History   Marital status: Married    Spouse name: Not on file   Number of children: Not on file   Years of education: Not on file   Highest education level: Not on file  Occupational History   Not on file  Tobacco Use   Smoking status: Never   Smokeless tobacco: Former    Types: Sarina Ser    Quit date: 03/2017  Vaping Use   Vaping Use: Never used  Substance and Sexual Activity   Alcohol use: Yes    Alcohol/week: 0.0 standard drinks   Drug use: Never   Sexual activity: Not on file  Other Topics Concern   Not on file  Social History Narrative   Not on file   Social Determinants of Health   Financial Resource Strain: Not on file  Food Insecurity: Not on file  Transportation Needs: Not on file  Physical  Activity: Not on file  Stress: Not on file  Social Connections: Not on file  Intimate Partner Violence: Not on file    Review of Systems: See HPI, otherwise negative ROS  Physical Exam: BP 125/70    Pulse 66    Temp (!) 96 F (35.6 C) (Temporal)    Resp 18    Ht 6\' 4"  (1.93 m)    Wt 136.1 kg    SpO2 96%    BMI 36.52 kg/m  General:   Alert,  pleasant and cooperative in NAD Head:  Normocephalic and atraumatic. Neck:  Supple; no masses or thyromegaly. Lungs:  Clear throughout to auscultation, normal respiratory effort.    Heart:  +S1, +S2, Regular rate and rhythm, No edema. Abdomen:  Soft, nontender and nondistended. Normal  bowel sounds, without guarding, and without rebound.   Neurologic:  Alert and  oriented x4;  grossly normal neurologically.  Impression/Plan: Nicholas Hobbs is here for an endoscopy  to be performed for  evaluation of dysphagia.     Risks, benefits, limitations, and alternatives regarding endoscopy have been reviewed with the patient.  Questions have been answered.  All parties agreeable.   Jonathon Bellows, MD  04/25/2021, 7:47 AM

## 2021-04-25 NOTE — Anesthesia Preprocedure Evaluation (Signed)
Anesthesia Evaluation  Patient identified by MRN, date of birth, ID band Patient awake    Reviewed: Allergy & Precautions, NPO status , Patient's Chart, lab work & pertinent test results  History of Anesthesia Complications Negative for: history of anesthetic complications  Airway Mallampati: II  TM Distance: >3 FB Neck ROM: Full    Dental no notable dental hx.    Pulmonary sleep apnea , neg COPD,    breath sounds clear to auscultation- rhonchi (-) wheezing      Cardiovascular Exercise Tolerance: Good (-) hypertension(-) CAD, (-) Past MI, (-) Cardiac Stents and (-) CABG  Rhythm:Regular Rate:Normal - Systolic murmurs and - Diastolic murmurs    Neuro/Psych neg Seizures negative neurological ROS  negative psych ROS   GI/Hepatic Neg liver ROS, GERD  Medicated and Controlled,  Endo/Other  negative endocrine ROSneg diabetes  Renal/GU negative Renal ROS     Musculoskeletal  (+) Arthritis ,   Abdominal (+) + obese,   Peds  Hematology negative hematology ROS (+)   Anesthesia Other Findings Past Medical History: No date: GERD (gastroesophageal reflux disease) No date: Pancreatitis No date: Rib fracture No date: Shoulder fracture, right No date: Sleep apnea   Reproductive/Obstetrics                             Anesthesia Physical  Anesthesia Plan  ASA: 3  Anesthesia Plan: General   Post-op Pain Management:    Induction: Intravenous  PONV Risk Score and Plan: 1 and Propofol infusion  Airway Management Planned: Natural Airway  Additional Equipment:   Intra-op Plan:   Post-operative Plan:   Informed Consent: I have reviewed the patients History and Physical, chart, labs and discussed the procedure including the risks, benefits and alternatives for the proposed anesthesia with the patient or authorized representative who has indicated his/her understanding and acceptance.      Dental advisory given  Plan Discussed with: CRNA and Anesthesiologist  Anesthesia Plan Comments: (Patient consented for risks of anesthesia including but not limited to:  - adverse reactions to medications - risk of airway placement if required - damage to eyes, teeth, lips or other oral mucosa - nerve damage due to positioning  - sore throat or hoarseness - Damage to heart, brain, nerves, lungs, other parts of body or loss of life  Patient voiced understanding.)        Anesthesia Quick Evaluation

## 2021-04-25 NOTE — Anesthesia Postprocedure Evaluation (Signed)
Anesthesia Post Note  Patient: Nicholas Hobbs  Procedure(s) Performed: ESOPHAGOGASTRODUODENOSCOPY (EGD) WITH PROPOFOL  Patient location during evaluation: Endoscopy Anesthesia Type: General Level of consciousness: awake and alert Pain management: pain level controlled Vital Signs Assessment: post-procedure vital signs reviewed and stable Respiratory status: spontaneous breathing, nonlabored ventilation, respiratory function stable and patient connected to nasal cannula oxygen Cardiovascular status: blood pressure returned to baseline and stable Postop Assessment: no apparent nausea or vomiting Anesthetic complications: no   No notable events documented.   Last Vitals:  Vitals:   04/25/21 0809 04/25/21 0819  BP: 114/88   Pulse: 73   Resp: 18   Temp:    SpO2: 94% 94%    Last Pain:  Vitals:   04/25/21 0819  TempSrc:   PainSc: 0-No pain                 Precious Haws Mickenzie Stolar

## 2021-04-28 ENCOUNTER — Encounter: Payer: Self-pay | Admitting: Gastroenterology

## 2021-04-28 LAB — SURGICAL PATHOLOGY

## 2021-05-14 ENCOUNTER — Ambulatory Visit: Payer: 59 | Admitting: Gastroenterology

## 2021-05-15 ENCOUNTER — Other Ambulatory Visit: Payer: Self-pay

## 2021-05-15 ENCOUNTER — Ambulatory Visit (INDEPENDENT_AMBULATORY_CARE_PROVIDER_SITE_OTHER): Payer: 59 | Admitting: Gastroenterology

## 2021-05-15 ENCOUNTER — Encounter: Payer: Self-pay | Admitting: Gastroenterology

## 2021-05-15 VITALS — BP 109/73 | HR 69 | Temp 98.0°F | Ht 76.0 in | Wt 312.0 lb

## 2021-05-15 DIAGNOSIS — R1310 Dysphagia, unspecified: Secondary | ICD-10-CM | POA: Diagnosis not present

## 2021-05-15 DIAGNOSIS — K219 Gastro-esophageal reflux disease without esophagitis: Secondary | ICD-10-CM | POA: Diagnosis not present

## 2021-05-15 MED ORDER — OMEPRAZOLE 20 MG PO CPDR: 20.0000 mg | DELAYED_RELEASE_CAPSULE | Freq: Every day | ORAL | 3 refills | Status: AC

## 2021-05-15 NOTE — Patient Instructions (Signed)

## 2021-05-15 NOTE — Progress Notes (Signed)
Nicholas Bellows MD, MRCP(U.K) [address]  [place]  [place], [place] [address]  Main: [phone]  Fax: [phone]   Primary Care Physician: Birdie Sons, MD  Primary Gastroenterologist:  Dr. Jonathon Hobbs   Chief Complaint  Patient presents with   Dysphagia    HPI: Nicholas Hobbs is a 51 y.o. male   Summary of history :  He has previously been seen by Dr. Bonna Gains for dysphagia back in December 2022.  He has a history of GERD on PPI for the past 10 years.  At his last visit had intermittent dysphagia to solid foods was recommended upper and lower endoscopy.  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  Back in 2018 I performed an EGD for him and which showed esophagitis with plan to follow-up subsequently but he did not follow-up.  04/25/2021 EGD: Normal GI tract esophagus was biopsied and no significant abnormality noted Since last visit symptoms of dysphagia resolved he is on Prilosec 40 mg once a day.  During lifestyle changes.  We discussed about long-term effects of PPI use and the aim of trying to reduce the dose to the lowest tolerated and required   Current Outpatient Medications  Medication Sig Dispense Refill   Ascorbic Acid (VITAMIN C) 1000 MG tablet Take 1,000 mg by mouth daily.      cetirizine (ZYRTEC) 10 MG tablet Take 10 mg by mouth daily as needed for allergies.     CINNAMON PO Take 1 tablet by mouth daily.     Multiple Vitamins-Minerals (MULTIVITAMIN WITH MINERALS) tablet Take 1 tablet by mouth daily.     omeprazole (PRILOSEC) 40 MG capsule Take 1 capsule (40 mg total) by mouth daily. 30 capsule 1   sildenafil (REVATIO) 20 MG tablet TAKE 2-5 TABLETS BY MOUTH ONCE DAILY AS NEEDED FOR ERECTILE DYSFUNCTION 50 tablet 5   sildenafil (VIAGRA) 50 MG tablet Take 1 tablet (50 mg total) by mouth daily as needed for erectile dysfunction. 30 tablet 3   No current facility-administered medications for this visit.     Allergies as of 05/15/2021 - Review Complete 05/15/2021  Allergen Reaction Noted   Biaxin [clarithromycin] Itching    Cat hair extract Other (See Comments) 12/03/2014   Other Itching 02/01/2012    ROS:  General: Negative for anorexia, weight loss, fever, chills, fatigue, weakness. ENT: Negative for hoarseness, difficulty swallowing , nasal congestion. CV: Negative for chest pain, angina, palpitations, dyspnea on exertion, peripheral edema.  Respiratory: Negative for dyspnea at rest, dyspnea on exertion, cough, sputum, wheezing.  GI: See history of present illness. GU:  Negative for dysuria, hematuria, urinary incontinence, urinary frequency, nocturnal urination.  Endo: Negative for unusual weight change.    Physical Examination:   BP 109/73    Pulse 69    Temp 98 F (36.7 C) (Oral)    Ht 6\' 4"  (1.93 m)    Wt (!) 312 lb (141.5 kg)    BMI 37.98 kg/m   General: Well-nourished, well-developed in no acute distress.  Eyes: No icterus. Conjunctivae pink. Neuro: Alert and oriented x 3.  Grossly intact. Skin: Warm and dry, no jaundice.   Psych: Alert and cooperative, normal mood and affect.   Imaging Studies: No results found.  Assessment and Plan:   Nicholas Hobbs is a 51 y.o. y/o male here to follow-up for dysphagia and acid reflux.  Recent endoscopy in January 2023 showed no stricture or evidence of eosinophilic  esophagitis.  Since his last visit his dysphagia has resolved with 40 mg of Prilosec once a day.  We discussed about lifestyle changes which she will try and follow diligently.  I will give him a picture of a wedge pillow to use more often.  If not a wedge pillow he can try to raise the head end of the bed by a few inches.  We will also reduce the dose of Prilosec to 20 mg once a day and he will try to lose weight and in 6 months when he comes to see me we will try to reduce his medication further and change it to Pepcid 20 mg at night depending on his response.    Dr  Nicholas Bellows  MD,MRCP [facility]) Follow up in 6 months

## 2021-07-04 ENCOUNTER — Other Ambulatory Visit: Payer: Self-pay

## 2021-07-04 ENCOUNTER — Encounter: Payer: Self-pay | Admitting: Family Medicine

## 2021-07-04 ENCOUNTER — Ambulatory Visit (INDEPENDENT_AMBULATORY_CARE_PROVIDER_SITE_OTHER): Payer: 59 | Admitting: Family Medicine

## 2021-07-04 VITALS — BP 118/80 | HR 70 | Temp 98.3°F | Resp 16 | Wt 301.0 lb

## 2021-07-04 DIAGNOSIS — G4710 Hypersomnia, unspecified: Secondary | ICD-10-CM | POA: Diagnosis not present

## 2021-07-04 DIAGNOSIS — N529 Male erectile dysfunction, unspecified: Secondary | ICD-10-CM | POA: Diagnosis not present

## 2021-07-04 DIAGNOSIS — Z23 Encounter for immunization: Secondary | ICD-10-CM | POA: Diagnosis not present

## 2021-07-04 DIAGNOSIS — G4733 Obstructive sleep apnea (adult) (pediatric): Secondary | ICD-10-CM

## 2021-07-04 MED ORDER — TADALAFIL 20 MG PO TABS: 10.0000 mg | ORAL_TABLET | ORAL | 5 refills | Status: AC | PRN

## 2021-07-04 NOTE — Progress Notes (Addendum)
      Established patient visit   Patient: Nicholas Hobbs   DOB: October 03, 1970   51 y.o. Male  MRN: [id number] Visit Date: 07/04/2021  Today's healthcare provider: Lelon Huh, MD   No chief complaint on file.  Subjective    HPI  Erectile Dysfunction: Patient complains of erectile dysfunction.  Has had difficulty maintaining adequate erections for intercourse for several years. Has tired sildenafil which was not effective at low doses, and caused headaches at higher doses.   He also reports history of sleep apnea dx in 2010. Tried CPAP which he did not tolerate. Was then fitted with dental appliance by his dentist which he states has been very effective, but is now worn out. He is working with his dentist to get a replacement, but needs a current sleep study.   Medications: Outpatient Medications Prior to Visit  Medication Sig   Ascorbic Acid (VITAMIN C) 1000 MG tablet Take 1,000 mg by mouth daily.    cetirizine (ZYRTEC) 10 MG tablet Take 10 mg by mouth daily as needed for allergies.   CINNAMON PO Take 1 tablet by mouth daily.   Multiple Vitamins-Minerals (MULTIVITAMIN WITH MINERALS) tablet Take 1 tablet by mouth daily.   omeprazole (PRILOSEC) 20 MG capsule Take 1 capsule (20 mg total) by mouth daily.   sildenafil (REVATIO) 20 MG tablet TAKE 2-5 TABLETS BY MOUTH ONCE DAILY AS NEEDED FOR ERECTILE DYSFUNCTION   sildenafil (VIAGRA) 50 MG tablet Take 1 tablet (50 mg total) by mouth daily as needed for erectile dysfunction.   No facility-administered medications prior to visit.        Objective    BP 118/80 (BP Location: Right Arm, Patient Position: Sitting, Cuff Size: Large)   Pulse 70   Temp 98.3 F (36.8 C) (Oral)   Resp 16   Wt (!) 301 lb (136.5 kg)   SpO2 97%   BMI 36.64 kg/m    Physical Exam   General appearance: Obese male, cooperative and in no acute distress Head: Normocephalic, without obvious abnormality, atraumatic Respiratory: Respirations even and  unlabored, normal respiratory rate Extremities: All extremities are intact.  Skin: Skin color, texture, turgor normal. No rashes seen  Psych: Appropriate mood and affect. Neurologic: Mental status: Alert, oriented to person, place, and time, thought content appropriate.     Assessment & Plan     1. OSA (obstructive sleep apnea)  2. Hypersomnia Has done very well with dental appliance that is now nearly 52 years old and wearing out. Need current MPSG to demonstrat medical necessity for OSA treatment - Ambulatory referral to Sleep Studies  3. Erectile dysfunction, unspecified erectile dysfunction type  - tadalafil (CIALIS) 20 MG tablet; Take 0.5-1 tablets (10-20 mg total) by mouth every other day as needed for erectile dysfunction.  Dispense: 30 tablet; Refill: 5  4. Need for shingles vaccine  - Varicella-zoster vaccine IM #2.      The entirety of the information documented in the History of Present Illness, Review of Systems and Physical Exam were personally obtained by me. Portions of this information were initially documented by the CMA and reviewed by me for thoroughness and accuracy.     Lelon Huh, MD  [facility] [phone] (phone) [phone] (fax)  [place]

## 2021-07-21 ENCOUNTER — Telehealth: Payer: Self-pay

## 2021-07-21 DIAGNOSIS — G4733 Obstructive sleep apnea (adult) (pediatric): Secondary | ICD-10-CM

## 2021-07-21 NOTE — Telephone Encounter (Signed)
Copied from [place] [phone]. Topic: General - Call Back - No Documentation ?>> Jul 21, 2021  2:02 PM Nicholas Hobbs wrote: ?Pt called to report that he is unable to have his sleep study done because the paperwork they received is incomplete. Pt is asking for the office to resolve this. ?

## 2021-07-22 NOTE — Telephone Encounter (Signed)
Nicholas Hobbs, can you follow up on this referral. Patient states that the referral paperwork is incomplete.  ?

## 2021-07-24 ENCOUNTER — Encounter: Payer: Self-pay | Admitting: Family Medicine

## 2021-07-24 NOTE — Addendum Note (Signed)
Addended by: Birdie Sons on: 07/24/2021 08:36 PM ? ? Modules accepted: Orders ? ?

## 2021-07-24 NOTE — Telephone Encounter (Signed)
Ok, I put I another order.  ?

## 2021-07-24 NOTE — Telephone Encounter (Signed)
He just needs screening MPSG to demonstrate that he still has sleep apnea and needs replacement dental appliance.  ?

## 2021-08-18 ENCOUNTER — Ambulatory Visit: Payer: 59 | Attending: Otolaryngology

## 2021-08-18 DIAGNOSIS — G4733 Obstructive sleep apnea (adult) (pediatric): Secondary | ICD-10-CM | POA: Diagnosis not present

## 2021-08-18 DIAGNOSIS — G4710 Hypersomnia, unspecified: Secondary | ICD-10-CM | POA: Diagnosis not present

## 2021-09-02 ENCOUNTER — Telehealth: Payer: Self-pay

## 2021-09-02 NOTE — Telephone Encounter (Signed)
Copied from [place] [phone]. Topic: General - Other >> Sep 02, 2021 11:38 AM Bayard Beaver wrote: Reason for CRM:  pt called in about lab results of sleep study. Please call back

## 2021-09-02 NOTE — Telephone Encounter (Signed)
Please advise. Patient had sleep study done on 08/18/2021. Copy of sleep study report has been scanned into "media" section (dated 08/22/2021). Please review and advise.

## 2021-09-03 NOTE — Telephone Encounter (Signed)
Sleep study confirms moderate to severe sleep apnea and needs to continue OSA treatment. I think he is working with his dentist for a replacement dental appliance, so he can have a copy of the sleep study report if that's what he needs.

## 2021-09-05 NOTE — Telephone Encounter (Signed)
Pt requesting results mailed to him to take to his dentist.  Report mailed to [address].

## 2021-09-05 NOTE — Telephone Encounter (Signed)
[facility]TR[facility] Okay for [facility] triage to advise.  

## 2021-09-05 NOTE — Telephone Encounter (Signed)
Patient returning call.

## 2021-09-17 ENCOUNTER — Telehealth: Payer: Self-pay

## 2021-09-17 NOTE — Telephone Encounter (Signed)
Copied from [place] [phone]. Topic: General - Other >> Sep 17, 2021  1:57 PM Tessa Lerner A wrote: Reason for CRM: Alyssa with Dr. Roselyn Reef Peterson's office [name] has called to request a referral for the patient to receive a Mandibular advancement device (MAD) for their sleep apnia concerns   Please contact the patient further if needed

## 2021-09-18 NOTE — Telephone Encounter (Signed)
I called Nicholas Hobbs and spoke with Alyssa. She states that patient submitted a copy of his sleep study to them which showed severe sleep apnea. Alyssa says that she needs Dr. Caryn Section to send them a referral order for a Mandibular advancement device (MAD) for his sleep apnea. Alyssa says that the referral can be a word document with our [facility] letter head stating the need for the device, due to sleep apnea.   Referral letter can be faxed to [phone] ATTN: Yetta Flock

## 2021-09-18 NOTE — Progress Notes (Signed)
Obstructive sleep apnea, hypersomnia

## 2021-09-25 ENCOUNTER — Telehealth: Payer: Self-pay

## 2021-09-25 DIAGNOSIS — G4733 Obstructive sleep apnea (adult) (pediatric): Secondary | ICD-10-CM

## 2021-09-25 NOTE — Telephone Encounter (Signed)
Copied from [place] [phone]. Topic: Referral - Question >> Sep 25, 2021 10:25 AM Rudene Anda wrote: Reason for CRM: Pt called in requesting a referral sent to [facility] in order for him to get a mouth guard made, please advise.

## 2021-09-30 NOTE — Telephone Encounter (Signed)
Nicholas Hobbs from [facility] they have a sleep study and need a referral from the PCP to get the patient scheduled.     Fax- [phone]  Please advise.

## 2021-11-13 ENCOUNTER — Ambulatory Visit: Payer: 59 | Admitting: Gastroenterology

## 2021-12-08 ENCOUNTER — Telehealth: Payer: Self-pay

## 2021-12-08 NOTE — Telephone Encounter (Signed)
Abby with [facility] calling back. She is stating that they need a prescription for CPAP. Can fax to [phone]. Advised will send to provider.

## 2021-12-08 NOTE — Telephone Encounter (Signed)
Why is the dentist requesting order for CPAP? His sleep apnea is treated with a dental appliance by patient report.

## 2021-12-08 NOTE — Telephone Encounter (Signed)
[place] returned call, they don't know what the name of the sleep appliance is, but I dint get them to NT, they hung up, was told they will call back

## 2021-12-08 NOTE — Telephone Encounter (Signed)
Received CB from [place] hung up or was disconnected during transfer t to NT. Attempted to reach, left VM to CB.

## 2021-12-08 NOTE — Telephone Encounter (Signed)
Tried returning call to [facility] dental. Left message to call back.Our referral notes in patients chart state that referral was placed on 09/29/2021. Referral order was then faxed 09/30/2021, and appointment was scheduled for 12/03/2021 at 3:30pm. Need to know what additional orders are needed. OK for [facility] triage to speak with [facility].

## 2021-12-08 NOTE — Telephone Encounter (Signed)
Copied from [place] [phone]. Topic: General - Other >> Dec 08, 2021 11:39 AM [place] B wrote: Reason for CRM: [name] from [facility], called in about getting sleep appliance order for patient.

## 2021-12-09 ENCOUNTER — Telehealth: Payer: Self-pay

## 2021-12-09 NOTE — Telephone Encounter (Signed)
Copy of order emailed to [facility]. See separate phone message.

## 2021-12-09 NOTE — Telephone Encounter (Signed)
I called Nicholas Hobbs and spoke with [name]. She informed me that they do not need an order for a CPAP. They need a prescription order for a  "mandibular advancement device" for patients sleep apnea. [name] asked that we fax the order to [name]; fax # [phone]. I tried faxing the order 4 different times, but the fax failed each time. I tried calling [name] to ask for an alternative fax number. Left message to call back.   I also called patient and left a detailed message on his voice message system (ok per DPR), asking if he could come by our office to pick up the order to take to [name], since the fax  would not go through. OK for [name] triage to speak with [name] dental or patient about this situation.

## 2021-12-09 NOTE — Telephone Encounter (Signed)
Done. Copy of order emailed .

## 2021-12-09 NOTE — Telephone Encounter (Signed)
Copied from [place] [phone]. Topic: General - Other >> Dec 09, 2021  2:32 PM Sabas Sous wrote: Reason for CRM: Message for Shirlean Schlein contact: [phone]  Pamala Hurry, from [place] says to email information instead of faxing  Email it to [email or web address] and [email or web address]

## 2021-12-09 NOTE — Telephone Encounter (Signed)
Copied from [place] [id number]. Topic: General - Other >> Dec 09, 2021 12:21 PM Everette C wrote: Reason for CRM: [name] with Dr Corky Sing office has returned a call from [place]   Please contact further when possible

## 2021-12-10 NOTE — Telephone Encounter (Signed)
Nicholas Hobbs is calling back in to follow up because they have not received order. The correct e-mail address is: [email or web address]    Phone: [phone] -

## 2021-12-12 NOTE — Telephone Encounter (Signed)
Done. E -mail resent to:  [email or web address]

## 2022-02-19 ENCOUNTER — Ambulatory Visit (INDEPENDENT_AMBULATORY_CARE_PROVIDER_SITE_OTHER): Payer: 59 | Admitting: Dermatology

## 2022-02-19 DIAGNOSIS — L821 Other seborrheic keratosis: Secondary | ICD-10-CM

## 2022-02-19 DIAGNOSIS — L570 Actinic keratosis: Secondary | ICD-10-CM

## 2022-02-19 DIAGNOSIS — B360 Pityriasis versicolor: Secondary | ICD-10-CM

## 2022-02-19 DIAGNOSIS — L814 Other melanin hyperpigmentation: Secondary | ICD-10-CM

## 2022-02-19 DIAGNOSIS — Z79899 Other long term (current) drug therapy: Secondary | ICD-10-CM

## 2022-02-19 DIAGNOSIS — Z5111 Encounter for antineoplastic chemotherapy: Secondary | ICD-10-CM

## 2022-02-19 DIAGNOSIS — L578 Other skin changes due to chronic exposure to nonionizing radiation: Secondary | ICD-10-CM

## 2022-02-19 DIAGNOSIS — Z1283 Encounter for screening for malignant neoplasm of skin: Secondary | ICD-10-CM

## 2022-02-19 DIAGNOSIS — D229 Melanocytic nevi, unspecified: Secondary | ICD-10-CM

## 2022-02-19 MED ORDER — KETOCONAZOLE 2 % EX CREA: 1.0000 | TOPICAL_CREAM | Freq: Every day | CUTANEOUS | 0 refills | Status: AC

## 2022-02-19 NOTE — Patient Instructions (Signed)
Instructions for [facility] Medications  One or more of your medications was sent to the [facility] mail order compounding pharmacy. You will receive an email from them and can purchase the medicine through that link. It will then be mailed to your home at the address you confirmed. If for any reason you do not receive an email from them, please check your spam folder. If you still do not find the email, please let us know. [facility] phone number is [phone].    Cryotherapy Aftercare  Wash gently with soap and water everyday.   Apply Vaseline and Band-Aid daily until healed.   Due to recent changes in healthcare laws, you may see results of your pathology and/or laboratory studies on MyChart before the doctors have had a chance to review them. We understand that in some cases there may be results that are confusing or concerning to you. Please understand that not all results are received at the same time and often the doctors may need to interpret multiple results in order to provide you with the best plan of care or course of treatment. Therefore, we ask that you please give us 2 business days to thoroughly review all your results before contacting the office for clarification. Should we see a critical lab result, you will be contacted sooner.   If You Need Anything After Your Visit  If you have any questions or concerns for your doctor, please call our main line at [phone] and press option 4 to reach your doctor's medical assistant. If no one answers, please leave a voicemail as directed and we will return your call as soon as possible. Messages left after 4 pm will be answered the following business day.   You may also send us a message via MyChart. We typically respond to MyChart messages within 1-2 business days.  For prescription refills, please ask your pharmacy to contact our office. Our fax number is [phone].  If you have an urgent issue when the clinic is  closed that cannot wait until the next business day, you can page your doctor at the number below.    Please note that while we do our best to be available for urgent issues outside of office hours, we are not available 24/7.   If you have an urgent issue and are unable to reach us, you may choose to seek medical care at your doctor's office, retail clinic, urgent care center, or emergency room.  If you have a medical emergency, please immediately call 911 or go to the emergency department.  Pager Numbers  - Dr. Kowalski: [phone]  - Dr. Moye: [phone]  - Dr. Stewart: [phone]  In the event of inclement weather, please call our main line at [phone] for an update on the status of any delays or closures.  Dermatology Medication Tips: Please keep the boxes that topical medications come in in order to help keep track of the instructions about where and how to use these. Pharmacies typically print the medication instructions only on the boxes and not directly on the medication tubes.   If your medication is too expensive, please contact our office at [phone] option 4 or send us a message through MyChart.   We are unable to tell what your co-pay for medications will be in advance as this is different depending on your insurance coverage. However, we may be able to find a substitute medication at lower cost or fill out paperwork to get insurance to cover a   needed medication.   If a prior authorization is required to get your medication covered by your insurance company, please allow us 1-2 business days to complete this process.  Drug prices often vary depending on where the prescription is filled and some pharmacies may offer cheaper prices.  The website www.goodrx.com contains coupons for medications through different pharmacies. The prices here do not account for what the cost may be with help from insurance (it may be cheaper with your insurance), but the website can  give you the price if you did not use any insurance.  - You can print the associated coupon and take it with your prescription to the pharmacy.  - You may also stop by our office during regular business hours and pick up a GoodRx coupon card.  - If you need your prescription sent electronically to a different pharmacy, notify our office through [place] MyChart or by phone at [phone] option 4.     Si Usted Necesita Algo Despus de Su Visita  Tambin puede enviarnos un mensaje a travs de MyChart. Por lo general respondemos a los mensajes de MyChart en el transcurso de 1 a 2 das hbiles.  Para renovar recetas, por favor pida a su farmacia que se ponga en contacto con nuestra oficina. Nuestro nmero de fax es el [phone].  Si tiene un asunto urgente cuando la clnica est cerrada y que no puede esperar hasta el siguiente da hbil, puede llamar/localizar a su doctor(a) al nmero que aparece a continuacin.   Por favor, tenga en cuenta que aunque hacemos todo lo posible para estar disponibles para asuntos urgentes fuera del horario de oficina, no estamos disponibles las 24 horas del da, los 7 das de la semana.   Si tiene un problema urgente y no puede comunicarse con nosotros, puede optar por buscar atencin mdica  en el consultorio de su doctor(a), en una clnica privada, en un centro de atencin urgente o en una sala de emergencias.  Si tiene una emergencia mdica, por favor llame inmediatamente al 911 o vaya a la sala de emergencias.  Nmeros de bper  - Dr. Kowalski: [phone]  - Dra. Moye: [phone]  - Dra. Stewart: [phone]  En caso de inclemencias del tiempo, por favor llame a nuestra lnea principal al [phone] para una actualizacin sobre el estado de cualquier retraso o cierre.  Consejos para la medicacin en dermatologa: Por favor, guarde las cajas en las que vienen los medicamentos de uso tpico para ayudarle a seguir las instrucciones sobre  dnde y cmo usarlos. Las farmacias generalmente imprimen las instrucciones del medicamento slo en las cajas y no directamente en los tubos del medicamento.   Si su medicamento es muy caro, por favor, pngase en contacto con nuestra oficina llamando al [phone] y presione la opcin 4 o envenos un mensaje a travs de MyChart.   No podemos decirle cul ser su copago por los medicamentos por adelantado ya que esto es diferente dependiendo de la cobertura de su seguro. Sin embargo, es posible que podamos encontrar un medicamento sustituto a menor costo o llenar un formulario para que el seguro cubra el medicamento que se considera necesario.   Si se requiere una autorizacin previa para que su compaa de seguros cubra su medicamento, por favor permtanos de 1 a 2 das hbiles para completar este proceso.  Los precios de los medicamentos varan con frecuencia dependiendo del lugar de dnde se surte la receta y alguna farmacias pueden ofrecer precios ms baratos.    El sitio web www.goodrx.com tiene cupones para medicamentos de diferentes farmacias. Los precios aqu no tienen en cuenta lo que podra costar con la ayuda del seguro (puede ser ms barato con su seguro), pero el sitio web puede darle el precio si no utiliz ningn seguro.  - Puede imprimir el cupn correspondiente y llevarlo con su receta a la farmacia.  - Tambin puede pasar por nuestra oficina durante el horario de atencin regular y recoger una tarjeta de cupones de GoodRx.  - Si necesita que su receta se enve electrnicamente a una farmacia diferente, informe a nuestra oficina a travs de MyChart de  o por telfono llamando al [phone] y presione la opcin 4.  

## 2022-02-19 NOTE — Progress Notes (Addendum)
New Patient Visit  Subjective  Nicholas Hobbs is a 50 y.o. male who presents for the following: Other (New patient - No history of skin cancer or abnormal moles - The patient presents for Total-Body Skin Exam (TBSE) for skin cancer screening and mole check.  The patient has spots, moles and lesions to be evaluated, some may be new or changing and the patient has concerns that these could be cancer./).  The following portions of the chart were reviewed this encounter and updated as appropriate:   Tobacco  Allergies  Meds  Problems  Med Hx  Surg Hx  Fam Hx     Review of Systems:  No other skin or systemic complaints except as noted in HPI or Assessment and Plan.  Objective  Well appearing patient in no apparent distress; mood and affect are within normal limits.  A full examination was performed including scalp, head, eyes, ears, nose, lips, neck, chest, axillae, abdomen, back, buttocks, bilateral upper extremities, bilateral lower extremities, hands, feet, fingers, toes, fingernails, and toenails. All findings within normal limits unless otherwise noted below.  Right Medial Thigh Multiple dyspigmented macules and patches with fine scale.   Scalp Erythematous thin papules/macules with gritty scale.    Assessment & Plan   Lentigines - Scattered tan macules - Due to sun exposure - Benign-appearing, observe - Recommend daily broad spectrum sunscreen SPF 30+ to sun-exposed areas, reapply every 2 hours as needed. - Call for any changes  Seborrheic Keratoses - Stuck-on, waxy, tan-brown papules and/or plaques  - Benign-appearing - Discussed benign etiology and prognosis. - Observe - Call for any changes  Melanocytic Nevi - Tan-brown and/or pink-flesh-colored symmetric macules and papules - Benign appearing on exam today - Observation - Call clinic for new or changing moles - Recommend daily use of broad spectrum spf 30+ sunscreen to sun-exposed areas.   Hemangiomas -  Red papules - Discussed benign nature - Observe - Call for any changes  Skin cancer screening performed today.  Tinea versicolor Right Medial Thigh Tinea versicolor is a chronic recurrent skin rash causing discolored scaly spots most commonly seen on back, chest, and/or shoulders.  It is generally asymptomatic. The rash is due to overgrowth of a common type of yeast present on everyone's skin and it is not contagious.  It tends to flare more in the summer due to increased sweating on trunk.  After rash is treated, the scaliness will resolve, but the discoloration will take longer to return to normal pigmentation. The periodic use of an OTC medicated soap/shampoo with zinc or selenium sulfide can be helpful to prevent yeast overgrowth and recurrence.  Start Ketoconazole 2% cream qd x 4 weeks then monthly for prevention  ketoconazole (NIZORAL) 2 % cream - Right Medial Thigh Apply 1 Application topically daily. 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 - diffuse scaly erythematous macules and papules with underlying dyspigmentation - Discussed Prescription "Field Treatment" topical Chemotherapy for Severe, Chronic Confluent Actinic Changes with Pre-Cancerous Actinic Keratoses Field treatment involves treatment of an entire area of skin that has confluent Actinic Changes (Sun/ Ultraviolet light damage) and PreCancerous Actinic Keratoses by method of PhotoDynamic Therapy (PDT) and/or prescription Topical Chemotherapy agents such as 5-fluorouracil, 5-fluorouracil/calcipotriene, and/or  imiquimod.  The purpose is to decrease the number of clinically evident and  subclinical PreCancerous lesions to prevent progression to development of skin cancer by chemically destroying early precancer changes that may or may not be visible.  It has been shown to reduce the risk of developing skin cancer in the treated area. As a result of treatment, redness, scaling, crusting, and open sores may occur during treatment course. One or more than one of these methods may be used and may have to be used several times to control, suppress and eliminate the PreCancerous changes. Discussed treatment course, expected reaction, and possible side effects. - Recommend daily broad spectrum sunscreen SPF 30+ to sun-exposed areas, reapply every 2 hours as needed.  - Staying in the shade or wearing long sleeves, sun glasses (UVA+UVB protection) and wide brim hats (4-inch brim around the entire circumference of the hat) are also recommended. - Call for new or changing lesions.  - Start 5-fluorouracil/calcipotriene cream twice a day for 7 days to affected areas including scalp. Prescription sent to [facility]. Patient advised they will receive an email to purchase the medication online and have it sent to their home. Patient provided with handout reviewing treatment course and side effects and advised to call or message [place] on MyChart with any concerns.   Return in about 6 months (around 08/20/2022) for AK follow up.  I, Ashok Cordia, CMA, am acting as scribe for Sarina Ser, MD . Documentation: I have reviewed the above documentation for accuracy and completeness, and I agree with the above.  Sarina Ser, MD

## 2022-02-28 ENCOUNTER — Encounter: Payer: Self-pay | Admitting: Dermatology

## 2022-02-28 NOTE — Addendum Note (Signed)
Addended by: Ralene Bathe on: 02/28/2022 08:41 PM   Modules accepted: Level of Service

## 2022-03-25 ENCOUNTER — Encounter: Payer: Self-pay | Admitting: Family Medicine

## 2022-08-20 ENCOUNTER — Ambulatory Visit: Payer: 59 | Admitting: Dermatology

## 2022-12-11 ENCOUNTER — Ambulatory Visit: Payer: 59 | Admitting: Family Medicine

## 2023-08-19 ENCOUNTER — Ambulatory Visit: Payer: Self-pay

## 2023-08-19 ENCOUNTER — Ambulatory Visit
Admission: RE | Admit: 2023-08-19 | Discharge: 2023-08-19 | Disposition: A | Discharge status: Home / Self Care | Source: Ambulatory Visit | Attending: Emergency Medicine | Admitting: Emergency Medicine

## 2023-08-19 VITALS — BP 115/77 | HR 67 | Temp 97.8°F | Resp 18

## 2023-08-19 DIAGNOSIS — Z23 Encounter for immunization: Secondary | ICD-10-CM | POA: Diagnosis not present

## 2023-08-19 DIAGNOSIS — L03115 Cellulitis of right lower limb: Secondary | ICD-10-CM

## 2023-08-19 DIAGNOSIS — L089 Local infection of the skin and subcutaneous tissue, unspecified: Secondary | ICD-10-CM | POA: Diagnosis not present

## 2023-08-19 MED ORDER — TETANUS-DIPHTH-ACELL PERTUSSIS 5-2.5-18.5 LF-MCG/0.5 IM SUSY
0.5000 mL | PREFILLED_SYRINGE | Freq: Once | INTRAMUSCULAR | Status: AC
  Administered 2023-08-19: 0.5 mL via INTRAMUSCULAR

## 2023-08-19 MED ORDER — DOXYCYCLINE HYCLATE 100 MG PO CAPS: 100.0000 mg | ORAL_CAPSULE | Freq: Two times a day (BID) | ORAL | 0 refills | Status: AC

## 2023-08-19 NOTE — Telephone Encounter (Signed)
 Chief Complaint: Skin injury Symptoms: redness/open skin/updated Tetanus needed Frequency: occurred on Saturday Pertinent Negatives: Patient denies fever, chills Disposition: [] ED /[x] Urgent Care (no appt availability in office) / [] Appointment(In office/virtual)/ []  [place] [facility]/ [] Home Care/ [] Refused Recommended Disposition /[] [place] [facility]/ []  Follow-up with PCP Additional Notes: Patient called in stating he cut the front of his shin open on Saturday when stepping down off of a boat trailer. Patient states him and his wife both believe the appearance of the open area looks infected. Patient denies drainage, but states there is a reddened border. Patient has been treating with peroxide, neosporin , and keeping it clean and covered. Patient's last tetanus shows to be 2019. Patient advised to visit UC for tetanus booster and evaluation.   Patient also not showing PCP on file, but states he used to see a provider that left the practice at [facility]. Patient states he has seen Dr. Shann Darnel multiple times and assumed Dr. Shann Darnel would be his new PCP. Patient asking if this will be the case or if he will need to call in to do a Transfer of Care appt. Patient states to please let him know so that he can schedule a prostate exam within the next month with a pcp.    Copied from CRM #[id number]. Topic: Clinical - Red Word Triage >> Aug 19, 2023  8:48 AM Nicholas Hobbs wrote: Red Word that prompted transfer to Nurse Triage: Pt called reporting that he fell last Saturday, scraped his shin, possibly infected Reason for Disposition  [1] Last tetanus shot > 5 years ago AND [2] DIRTY cut or scrape  Answer Assessment - Initial Assessment Questions 1. APPEARANCE of INJURY: "What does the injury look like?"      Scrapes/redness and mild infection looking on shin, bloody appearance, reddend  2. SIZE: "How large is the cut?"      Size of palm 3. BLEEDING: "Is it bleeding now?" If Yes,  ask: "Is it difficult to stop?"      Notice blood on bandages when changing bandaids 4. LOCATION: "Where is the injury located?"      Shin from middle of shin up to knee 5. ONSET: "How long ago did the injury occur?"      This past Saturday 6. MECHANISM: "Tell me how it happened."      Stevenson Elbe ran down front of trailer 7. TETANUS: "When was the last tetanus booster?"     2019  Protocols used: Skin Injury-A-AH

## 2023-08-19 NOTE — ED Triage Notes (Signed)
 Patient to Urgent Care with complaints of a large wound/ abrasion to his right leg right following an injury getting off a trailer. Injury occurred on Saturday.   Large abrasion extending from mid-shin to knee. Reports some tightness in his calf and ankle swelling. Redness around the abrasion. Cleaning w/ peroxide 2-3 times daily and applying neosporin .   TDAP 07/17/2017.

## 2023-08-19 NOTE — Discharge Instructions (Addendum)
 Keep your wound clean and dry.  Wash it gently twice a day with soap and water.  Then apply an antibiotic cream and bandage.    Follow-up with your primary care provider on Monday.  Go to the emergency department if you see signs of worsening infection, such as increased redness, pus-like drainage, fever, or other concerning symptoms.

## 2023-08-19 NOTE — ED Provider Notes (Signed)
 Nicholas Hobbs    CSN: [id number] Arrival date & time: 08/19/23  1706      History   Chief Complaint Chief Complaint  Patient presents with   Leg Injury    Entered by patient    HPI Nicholas Hobbs is a 53 y.o. male.  Patient presents with large wound on his right shin that occurred when he accidentally scraped his shin on a trailer.  The injury occurred on 08/14/2023.  The area around the wound has become red.  He has been treating it with peroxide and Neosporin .  No fever, numbness, weakness.  Last tetanus 2019.  The history is provided by the patient and medical records.    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   Fracture of scapula 07/21/2017   Erectile dysfunction 07/15/2015   Current tear knee, medial meniscus 05/08/2014   Arthritis of knee, degenerative 05/08/2014   Pancreatitis, recurrent 02/01/2012   PANCREATITIS 04/30/2008   Obstructive sleep apnea 04/30/2008    Past Surgical History:  Procedure Laterality Date   APPENDECTOMY  2007   COLONOSCOPY WITH PROPOFOL  N/A 09/27/2020   Procedure: COLONOSCOPY WITH PROPOFOL ;  Surgeon: Irby Mannan, MD;  Location: [facility] ENDOSCOPY;  Service: Endoscopy;  Laterality: N/A;   ESOPHAGOGASTRODUODENOSCOPY (EGD) WITH PROPOFOL  N/A 08/28/2016   Procedure: ESOPHAGOGASTRODUODENOSCOPY (EGD) WITH PROPOFOL  WITH DILATION;  Surgeon: Luke Salaam, MD;  Location: [facility] ENDOSCOPY;  Service: Endoscopy;  Laterality: N/A;   ESOPHAGOGASTRODUODENOSCOPY (EGD) WITH PROPOFOL  N/A 04/25/2021   Procedure: ESOPHAGOGASTRODUODENOSCOPY (EGD) WITH PROPOFOL ;  Surgeon: Luke Salaam, MD;  Location: [facility] ENDOSCOPY;  Service: Gastroenterology;  Laterality: N/A;   KNEE ARTHROPLASTY Left 03/23/2018   Procedure:  COMPUTER ASSISTED TOTAL KNEE ARTHROPLASTY;  Surgeon: Arlyne Lame, MD;  Location: [facility] ORS;  Service: Orthopedics;  Laterality: Left;   KNEE SURGERY Right 06/2014   meniscus repair       Home Medications    Prior to Admission medications   Medication Sig Start Date End Date Taking? Authorizing Provider  doxycycline  (VIBRAMYCIN ) 100 MG capsule Take 1 capsule (100 mg total) by mouth 2 (two) times daily. 08/19/23  Yes Wellington Half, NP  Ascorbic Acid  (VITAMIN C ) 1000 MG tablet Take 1,000 mg by mouth daily.     [provider]  cetirizine (ZYRTEC) 10 MG tablet Take 10 mg by mouth daily as needed for allergies.    [provider]  CINNAMON PO Take 1 tablet by mouth daily.    [provider]  Multiple Vitamins-Minerals (MULTIVITAMIN WITH MINERALS) tablet Take 1 tablet by mouth daily.    [provider]  omeprazole  (PRILOSEC) 20 MG capsule Take 1 capsule (20 mg total) by mouth daily. 05/15/21   Anna, Kiran, MD  tadalafil  (CIALIS ) 20 MG tablet Take 0.5-1 tablets (10-20 mg total) by mouth every other day as needed for erectile dysfunction. 07/04/21   Lamon Pillow, MD    Family History Family History  Problem Relation Age of Onset   Seizures Father     Social History Social History   Tobacco Use   Smoking status: Never   Smokeless tobacco: Former    Types: Chew    Quit date: 03/2017  Vaping  Use   Vaping status: Never Used  Substance Use Topics   Alcohol use: Yes    Alcohol/week: 0.0 standard drinks of alcohol   Drug use: Never     Allergies   Biaxin [clarithromycin], Cat dander, and Other   Review of Systems Review of Systems  Constitutional:  Negative for chills and fever.  Musculoskeletal:  Negative for arthralgias, gait problem and joint swelling.  Skin:  Positive for color change and wound.  Neurological:  Negative for weakness and numbness.     Physical Exam Triage Vital Signs ED Triage Vitals  Encounter Vitals Group     BP       Systolic BP Percentile      Diastolic BP Percentile      Pulse      Resp      Temp      Temp src      SpO2      Weight      Height      Head Circumference      Peak Flow      Pain Score      Pain Loc      Pain Education      Exclude from Growth Chart    No data found.  Updated Vital Signs BP 115/77   Pulse 67   Temp 97.8 F (36.6 C)   Resp 18   SpO2 95%   Visual Acuity Right Eye Distance:   Left Eye Distance:   Bilateral Distance:    Right Eye Near:   Left Eye Near:    Bilateral Near:     Physical Exam Constitutional:      General: He is not in acute distress. HENT:     Mouth/Throat:     Mouth: Mucous membranes are moist.  Cardiovascular:     Rate and Rhythm: Normal rate and regular rhythm.  Pulmonary:     Effort: Pulmonary effort is normal. No respiratory distress.  Musculoskeletal:        General: No deformity. Normal range of motion.     Comments: Wound on anterior right lower leg.  See picture.   Skin:    General: Skin is warm and dry.     Capillary Refill: Capillary refill takes less than 2 seconds.     Findings: Erythema and lesion present.  Neurological:     General: No focal deficit present.     Mental Status: He is alert.     Sensory: No sensory deficit.     Motor: No weakness.     Gait: Gait normal.      UC Treatments / Results  Labs (all labs ordered are listed, but only abnormal results are displayed) Labs Reviewed - No data to display  EKG   Radiology No results found.  Procedures Procedures (including critical care time)  Medications Ordered in UC Medications  Tdap (BOOSTRIX) injection 0.5 mL (0.5 mLs Intramuscular Given 08/19/23 1740)    Initial Impression / Assessment and Plan / UC Course  I have reviewed the triage vital signs and the nursing notes.  Pertinent labs & imaging results that were available during my care of the patient were reviewed by me and considered in my medical decision making (see chart for  details).    Cellulitis and infected wound on right lower leg.  Afebrile.  Tetanus updated today.  Treating with doxycycline .  Wound care instructions and signs of worsening infection discussed.  Instructed patient to follow-up with his PCP for  a wound recheck.  ED precautions given.  Education provided on cellulitis and infected wound.  Patient agrees to plan of care.  Final Clinical Impressions(s) / UC Diagnoses   Final diagnoses:  Cellulitis of right lower leg  Infected wound     Discharge Instructions      Keep your wound clean and dry.  Wash it gently twice a day with soap and water.  Then apply an antibiotic cream and bandage.    Follow-up with your primary care provider on Monday.  Go to the emergency department if you see signs of worsening infection, such as increased redness, pus-like drainage, fever, or other concerning symptoms.      ED Prescriptions     Medication Sig Dispense Auth. Provider   doxycycline  (VIBRAMYCIN ) 100 MG capsule Take 1 capsule (100 mg total) by mouth 2 (two) times daily. 20 capsule Wellington Half, NP      PDMP not reviewed this encounter.   Wellington Half, NP 08/19/23 1750
